# Patient Record
Sex: Male | Born: 2000
Health system: Southern US, Community
[De-identification: ages and names within clinical notes are randomized; demographics above are authoritative.]

## PROBLEM LIST (undated history)

## (undated) HISTORY — PX: OTHER SURGICAL HISTORY: SHX169

---
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
## 2021-08-25 ENCOUNTER — Encounter: Payer: Self-pay | Admitting: Family Medicine

## 2021-08-25 ENCOUNTER — Ambulatory Visit (INDEPENDENT_AMBULATORY_CARE_PROVIDER_SITE_OTHER): Payer: BC Managed Care – PPO | Admitting: Family Medicine

## 2021-08-25 DIAGNOSIS — R0789 Other chest pain: Secondary | ICD-10-CM

## 2021-08-25 MED ORDER — PREDNISONE 10 MG PO TABS: ORAL_TABLET | ORAL | 0 refills | Status: DC

## 2021-08-25 NOTE — Progress Notes (Signed)
Subjective:    Patient ID: Victor [name], male    DOB: Jun 04, 2001, 20 y.o.   MRN: [id number]   HPI: Victor [name] is a 20 y.o. male presenting for chest irritation. Pain between the shoulder blades. 5-7/10 Varies day to day. Not affected by activities. Ibuprofen helps for a couple of hours. Starts to come back after a couple of hours. Not painful to breathe, but feels dyspneic at times. Doesn't slow him down. Denies heartburn. This is different than the GERD he had several months ago. No longer taking the protonix. Denies cough.    Depression screen [facility] 2/9 11/12/2020 11/12/2020 11/15/2018 08/02/2018 01/08/2018  Decreased Interest 0 0 0 0 0  Down, Depressed, Hopeless 0 0 0 0 0  PHQ - 2 Score 0 0 0 0 0  Altered sleeping 0 - - - 0  Tired, decreased energy 1 - - - 0  Change in appetite 1 - - - 0  Feeling bad or failure about yourself  0 - - - 0  Trouble concentrating 0 - - - 0  Moving slowly or fidgety/restless 1 - - - 0  Suicidal thoughts 0 - - - 0  PHQ-9 Score 3 - - - 0     Relevant past medical, surgical, family and social history reviewed and updated as indicated.  Interim medical history since our last visit reviewed. Allergies and medications reviewed and updated.  ROS:  Review of Systems  Constitutional:  Negative for activity change, chills, fatigue and fever.  HENT: Negative.    Respiratory:  Positive for shortness of breath. Negative for cough.   Cardiovascular:  Positive for chest pain.  Gastrointestinal: Negative.  Negative for abdominal pain.  Musculoskeletal:  Positive for back pain (between shoulder blades). Negative for arthralgias.  Skin:  Negative for rash.    Social History   Tobacco Use  Smoking Status Never  Smokeless Tobacco Never       Objective:     Wt Readings from Last 3 Encounters:  11/12/20 154 lb 4 oz (70 kg) (53 %, Z= 0.08)*  04/29/19 149 lb (67.6 kg) (57 %, Z= 0.17)*  02/06/19 151 lb (68.5 kg) (62 %, Z= 0.30)*   * Growth  percentiles are based on CDC (Boys, 2-20 Years) data.     Exam deferred. Pt. Harboring due to COVID 19. Phone visit performed.   Assessment & Plan:   1. Acute chest wall pain      Diagnoses and all orders for this visit:  Acute chest wall pain  Other orders -     predniSONE (DELTASONE) 10 MG tablet; Take 5 daily for 2 days followed by 4,3,2 and 1 for 2 days each.   Virtual Visit via telephone Note  I discussed the limitations, risks, security and privacy concerns of performing an evaluation and management service by telephone and the availability of in person appointments. The patient was identified with two identifiers. Pt.expressed understanding and agreed to proceed. Pt. Is at home. Dr. Darlyn Read is in his office.  Follow Up Instructions:   I discussed the assessment and treatment plan with the patient. The patient was provided an opportunity to ask questions and all were answered. The patient agreed with the plan and demonstrated an understanding of the instructions.   The patient was advised to call back or seek an in-person evaluation if the symptoms worsen or if the condition fails to improve as anticipated.   Total minutes including chart review and phone contact time: 14  Follow up plan: Return if symptoms worsen or fail to improve.  Mechele Claude, MD [place] [facility]

## 2022-03-30 ENCOUNTER — Encounter: Payer: Self-pay | Admitting: Nurse Practitioner

## 2022-03-30 ENCOUNTER — Ambulatory Visit: Payer: BC Managed Care – PPO | Admitting: Nurse Practitioner

## 2022-03-30 ENCOUNTER — Ambulatory Visit (INDEPENDENT_AMBULATORY_CARE_PROVIDER_SITE_OTHER): Payer: BC Managed Care – PPO

## 2022-03-30 VITALS — BP 139/73 | HR 83 | Temp 98.7°F | Resp 18 | Ht 72.0 in | Wt 152.0 lb

## 2022-03-30 DIAGNOSIS — R1013 Epigastric pain: Secondary | ICD-10-CM | POA: Diagnosis not present

## 2022-03-30 DIAGNOSIS — R110 Nausea: Secondary | ICD-10-CM

## 2022-03-30 DIAGNOSIS — R509 Fever, unspecified: Secondary | ICD-10-CM

## 2022-03-30 LAB — URINALYSIS, ROUTINE W REFLEX MICROSCOPIC
Bilirubin, UA: NEGATIVE
Glucose, UA: NEGATIVE
Ketones, UA: NEGATIVE
Leukocytes,UA: NEGATIVE
Nitrite, UA: NEGATIVE
Protein,UA: NEGATIVE
RBC, UA: NEGATIVE
Specific Gravity, UA: 1.015 (ref 1.005–1.030)
Urobilinogen, Ur: 1 mg/dL (ref 0.2–1.0)
pH, UA: 7.5 (ref 5.0–7.5)

## 2022-03-30 MED ORDER — ONDANSETRON HCL 4 MG PO TABS: 4.0000 mg | ORAL_TABLET | Freq: Three times a day (TID) | ORAL | 0 refills | Status: DC | PRN

## 2022-03-30 MED ORDER — ACETAMINOPHEN 500 MG PO TABS: 500.0000 mg | ORAL_TABLET | Freq: Four times a day (QID) | ORAL | 0 refills | Status: DC | PRN

## 2022-03-30 NOTE — Patient Instructions (Signed)
Abdominal Pain, Adult Many things can cause belly (abdominal) pain. Most times, belly pain is not dangerous. Many cases of belly pain can be watched and treated at home. Sometimes, though, belly pain is serious. Your doctor will try to find the cause of your belly pain. Follow these instructions at home:  Medicines Take over-the-counter and prescription medicines only as told by your doctor. Do not take medicines that help you poop (laxatives) unless told by your doctor. General instructions Watch your belly pain for any changes. Drink enough fluid to keep your pee (urine) pale yellow. Keep all follow-up visits as told by your doctor. This is important. Contact a doctor if: Your belly pain changes or gets worse. You are not hungry, or you lose weight without trying. You are having trouble pooping (constipated) or have watery poop (diarrhea) for more than 2-3 days. You have pain when you pee or poop. Your belly pain wakes you up at night. Your pain gets worse with meals, after eating, or with certain foods. You are vomiting and cannot keep anything down. You have a fever. You have blood in your pee. Get help right away if: Your pain does not go away as soon as your doctor says it should. You cannot stop vomiting. Your pain is only in areas of your belly, such as the right side or the left lower part of the belly. You have bloody or black poop, or poop that looks like tar. You have very bad pain, cramping, or bloating in your belly. You have signs of not having enough fluid or water in your body (dehydration), such as: Dark pee, very little pee, or no pee. Cracked lips. Dry mouth. Sunken eyes. Sleepiness. Weakness. You have trouble breathing or chest pain. Summary Many cases of belly pain can be watched and treated at home. Watch your belly pain for any changes. Take over-the-counter and prescription medicines only as told by your doctor. Contact a doctor if your belly pain  changes or gets worse. Get help right away if you have very bad pain, cramping, or bloating in your belly. This information is not intended to replace advice given to you by your health care provider. Make sure you discuss any questions you have with your health care provider. Document Revised: 03/25/2019 Document Reviewed: 03/25/2019 Elsevier Patient Education  2023 Elsevier Inc[employer]  

## 2022-03-30 NOTE — Progress Notes (Signed)
? ?Acute Office Visit ? ?Subjective:  ? ?  ?Patient ID: Victor [name], male    DOB: [birth year], 21 y.o.   MRN: [id number] ? ?Chief Complaint  ?Patient presents with  ? Abdominal Pain  ?  For about a week now , no vomiting , no constipation ? ?Crampy feel , leaning over makes it feel better    ? ? ?Abdominal Pain ?This is a new problem. The current episode started in the past 7 days. The onset quality is gradual. The problem occurs constantly. The problem is unchanged. The pain is located in the generalized abdominal region and epigastric region. The pain is moderate. The pain radiates to the RLQ, RUQ and pelvis. Associated symptoms include nausea. Pertinent negatives include no fever or vomiting. Nothing relieves the symptoms. Past treatments include nothing.  ? ?Review of Systems  ?Constitutional: Negative.  Negative for fever.  ?HENT: Negative.    ?Eyes: Negative.   ?Cardiovascular: Negative.   ?Gastrointestinal:  Positive for abdominal pain and nausea. Negative for vomiting.  ?Skin: Negative.   ?All other systems reviewed and are negative. ? ? ?   ?Objective:  ?  ?BP 139/73 (BP Location: Right Arm, Patient Position: Sitting, Cuff Size: Normal)   Pulse 83   Temp 98.7 ?F (37.1 ?C) (Temporal)   Resp 18   Ht 6' (1.829 m)   Wt 152 lb (68.9 kg)   SpO2 97%   BMI 20.61 kg/m?  ?BP Readings from Last 3 Encounters:  ?03/30/22 139/73  ?11/12/20 137/82  ?04/29/19 (!) 129/76 (84 %, Z = 0.99 /  77 %, Z = 0.74)*  ? ?*BP percentiles are based on the 2017 AAP Clinical Practice Guideline for boys  ? ?Wt Readings from Last 3 Encounters:  ?03/30/22 152 lb (68.9 kg)  ?11/12/20 154 lb 4 oz (70 kg) (53 %, Z= 0.08)*  ?04/29/19 149 lb (67.6 kg) (57 %, Z= 0.17)*  ? ?* Growth percentiles are based on CDC (Boys, 2-20 Years) data.  ? ?  ? ?Physical Exam ?Vitals and nursing note reviewed.  ?Constitutional:   ?   Appearance: Normal appearance. He is well-developed.  ?HENT:  ?   Head: Normocephalic.  ?   Right Ear: External ear normal.   ?   Left Ear: External ear normal.  ?   Nose: Nose normal.  ?   Mouth/Throat:  ?   Mouth: Mucous membranes are moist.  ?Eyes:  ?   Conjunctiva/sclera: Conjunctivae normal.  ?Cardiovascular:  ?   Rate and Rhythm: Normal rate.  ?   Pulses: Normal pulses.  ?   Heart sounds: Normal heart sounds.  ?Pulmonary:  ?   Effort: Pulmonary effort is normal.  ?   Breath sounds: Normal breath sounds.  ?Abdominal:  ?   General: Bowel sounds are normal.  ?   Palpations: Abdomen is soft.  ?Musculoskeletal:     ?   General: Normal range of motion.  ?Skin: ?   General: Skin is warm.  ?   Findings: No rash.  ?Neurological:  ?   General: No focal deficit present.  ?   Mental Status: He is alert. He is disoriented.  ?Psychiatric:     ?   Behavior: Behavior normal.  ? ? ?No results found for any visits on 03/30/22. ? ? ?   ?Assessment & Plan:  ?Nausea, abdominal pain and fever not well controlled in the past few days ?-Completed KUB results pending ?-Urinalysis completed -results pending ?-tylenol or ibuprofen for pain /fever ?-  increase hydration ?-follow up with worsening or uncontrolled symptoms ?Patient may need Ultrasound in the future if symptoms are not resolved, this has been an ongoing issue.  ?Patient and family reports eating [employer] for dinner and everyone in the family has been sick. ? ?Problem List Items Addressed This Visit   ?None ?Visit Diagnoses   ? ? Epigastric pain    -  Primary  ? Relevant Orders  ? Urinalysis, Routine w reflex microscopic  ? DG Abd 1 View  ? Nausea      ? Relevant Medications  ? ondansetron (ZOFRAN) 4 MG tablet  ? Fever, unspecified fever cause      ? Relevant Medications  ? acetaminophen (TYLENOL) 500 MG tablet  ? ?  ? ? ?Meds ordered this encounter  ?Medications  ? ondansetron (ZOFRAN) 4 MG tablet  ?  Sig: Take 1 tablet (4 mg total) by mouth every 8 (eight) hours as needed for nausea or vomiting.  ?  Dispense:  20 tablet  ?  Refill:  0  ?  Order Specific Question:   Supervising Provider  ?  AnswerMechele Claude [[id number]]  ? acetaminophen (TYLENOL) 500 MG tablet  ?  Sig: Take 1 tablet (500 mg total) by mouth every 6 (six) hours as needed.  ?  Dispense:  30 tablet  ?  Refill:  0  ?  Order Specific Question:   Supervising Provider  ?  AnswerMechele Claude [[id number]]  ? ? ?Return if symptoms worsen or fail to improve. ? ?Daryll Drown, NP ? ? ?

## 2022-04-01 NOTE — Progress Notes (Signed)
Patient calling back about results. Please return call.  ?

## 2023-01-05 ENCOUNTER — Ambulatory Visit: Payer: BC Managed Care – PPO | Admitting: Family Medicine

## 2023-01-05 ENCOUNTER — Encounter: Payer: Self-pay | Admitting: Family Medicine

## 2023-01-05 VITALS — BP 139/79 | HR 86 | Ht 72.0 in | Wt 158.0 lb

## 2023-01-05 DIAGNOSIS — H6120 Impacted cerumen, unspecified ear: Secondary | ICD-10-CM

## 2023-01-05 DIAGNOSIS — H6122 Impacted cerumen, left ear: Secondary | ICD-10-CM

## 2023-01-05 NOTE — Progress Notes (Signed)
   BP 139/79   Pulse 86   Ht 6' (1.829 m)   Wt 158 lb (71.7 kg)   SpO2 99%   BMI 21.43 kg/m    Subjective:   Patient ID: Victor [name], male    DOB: May 01, 2001, 22 y.o.   MRN: [id number]  HPI: Victor [name] is a 22 y.o. male presenting on 01/05/2023 for Earwax (Pt has camera at home and he seen earwax in both ears. He denies pain.)   HPI Earwax Patient is coming in today because of problems with earwax.  He says sometimes it will build up to where he gets muffled and he feels like he has in his left ear right now.  He also had a long camera where he checked on the ear and felt like it was more plugged up.  Relevant past medical, surgical, family and social history reviewed and updated as indicated. Interim medical history since our last visit reviewed. Allergies and medications reviewed and updated.  Review of Systems  Constitutional:  Negative for chills and fever.  HENT:  Positive for hearing loss. Negative for ear discharge, ear pain, sinus pressure and sinus pain.     Per HPI unless specifically indicated above   Allergies as of 01/05/2023   No Known Allergies      Medication List        Accurate as of January 05, 2023  3:40 PM. If you have any questions, ask your nurse or doctor.          STOP taking these medications    acetaminophen 500 MG tablet Commonly known as: TYLENOL Stopped by: Fransisca Kaufmann Shadae Reino, MD   ondansetron 4 MG tablet Commonly known as: Zofran Stopped by: Fransisca Kaufmann Idrees Quam, MD         Objective:   BP 139/79   Pulse 86   Ht 6' (1.829 m)   Wt 158 lb (71.7 kg)   SpO2 99%   BMI 21.43 kg/m   Wt Readings from Last 3 Encounters:  01/05/23 158 lb (71.7 kg)  03/30/22 152 lb (68.9 kg)  11/12/20 154 lb 4 oz (70 kg) (53 %, Z= 0.08)*   * Growth percentiles are based on CDC (Boys, 2-20 Years) data.    Physical Exam Vitals and nursing note reviewed.  Constitutional:      Appearance: Normal appearance.  HENT:     Right Ear:  Tympanic membrane and ear canal normal. There is no impacted cerumen.     Left Ear: Tympanic membrane and ear canal normal. There is no impacted cerumen.  Neurological:     Mental Status: He is alert.     Nurse to lavage cerumen, minimal on left side and almost none on right side.  Patient tolerated well  Assessment & Plan:   Problem List Items Addressed This Visit   None Visit Diagnoses     Cerumen in auditory canal on examination    -  Primary        Follow up plan: Return if symptoms worsen or fail to improve.  Counseling provided for all of the vaccine components No orders of the defined types were placed in this encounter.   Caryl Pina, MD [facility] 01/05/2023, 3:40 PM

## 2023-07-26 IMAGING — DX DG ABDOMEN 1V
1 series · 2 of 2 positions shown · non-contrast
Comparison: None Available.

CLINICAL DATA: Lower abdominal pain

EXAM:
ABDOMEN - 1 VIEW

[Series 1: abdomen kub · 0.14mm/px · 2 of 2 slices shown]
[im 1/2]
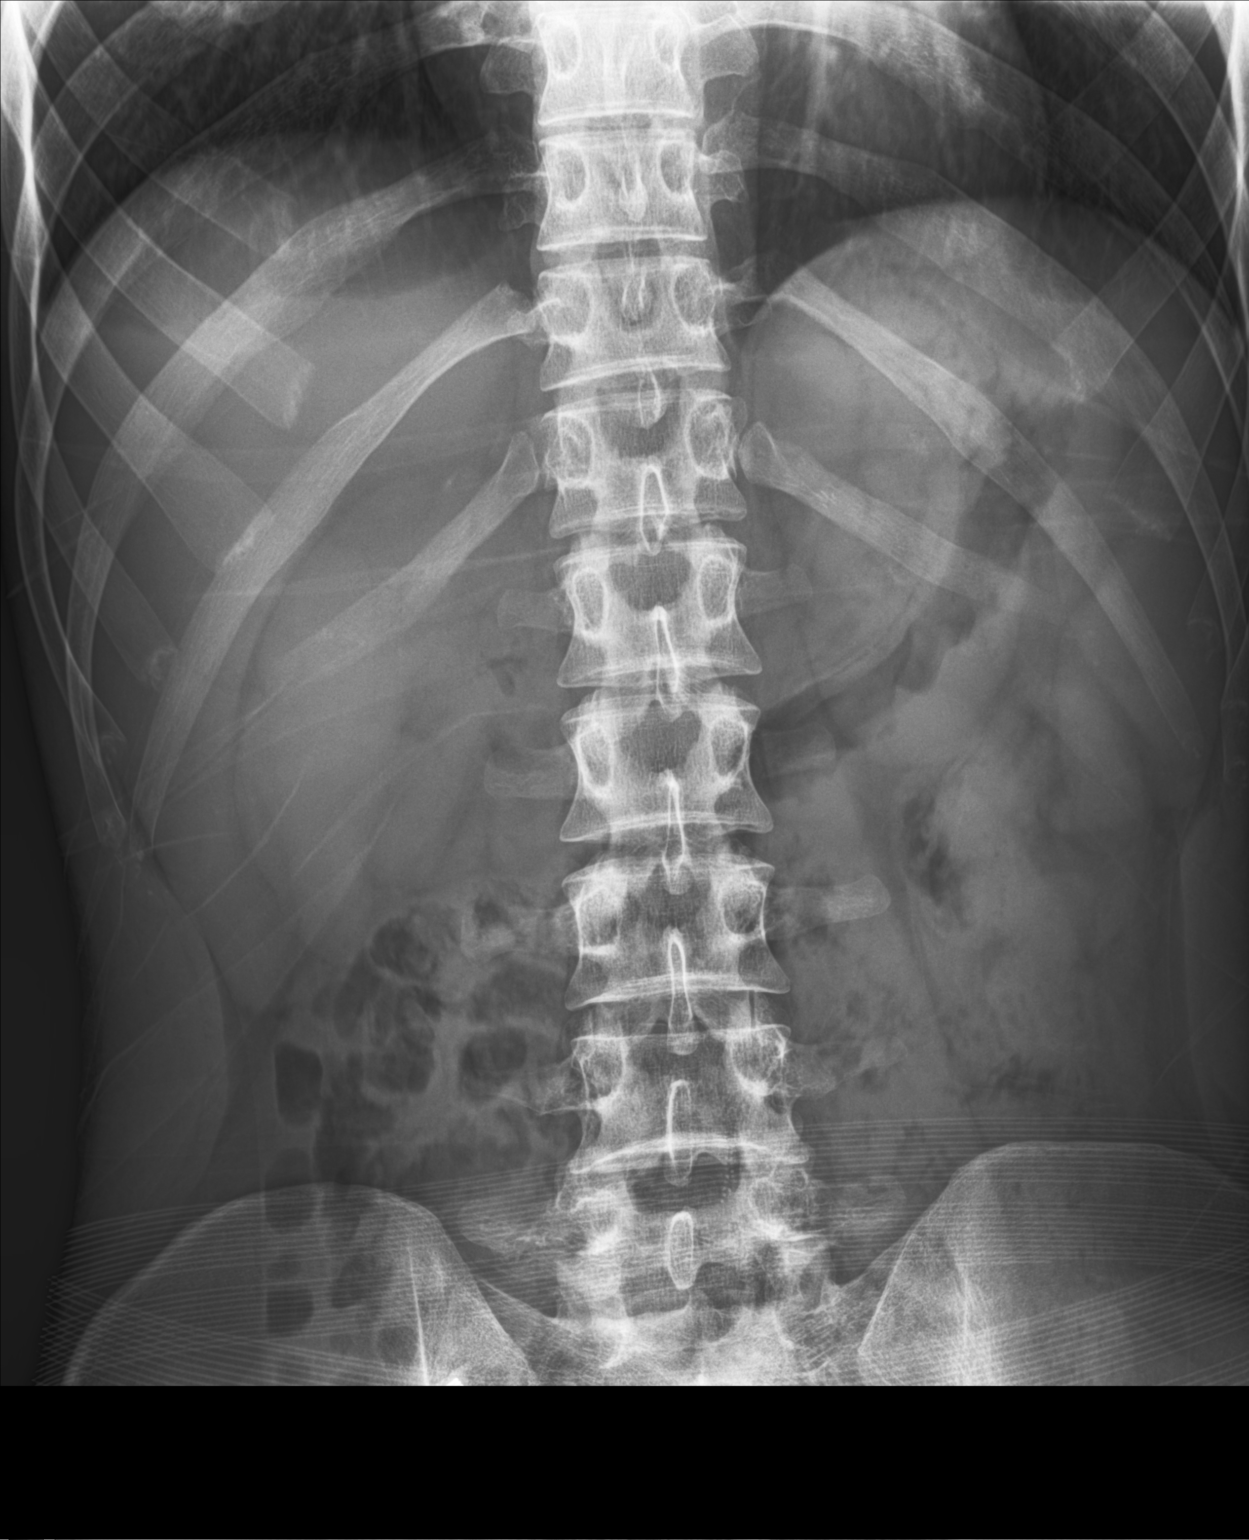
[im 2/2]
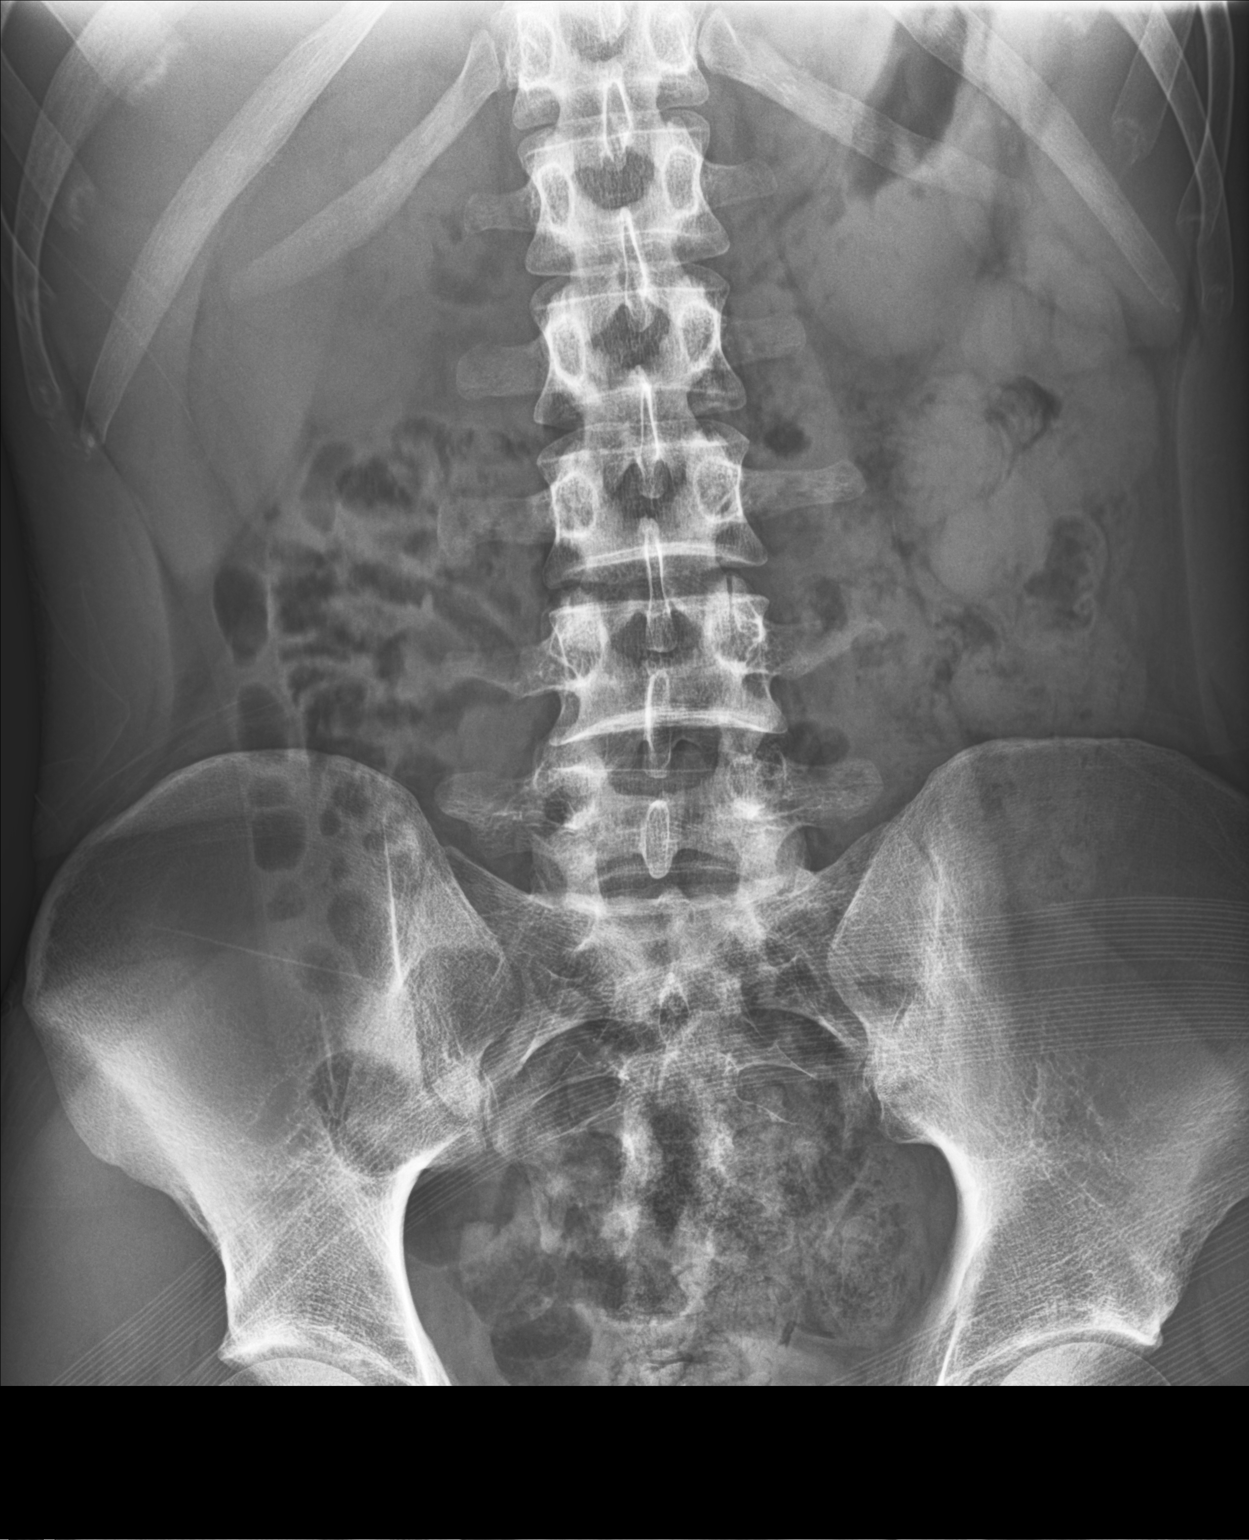

[2 of 2 positions shown; findings below may reference images not displayed]

FINDINGS: Lung bases are clear. Large amount of stool within the distal colon
and rectum. Gas in nondilated loops of large and small bowel.
Osseous structures unremarkable.
IMPRESSION: Large amount of stool in the distal colon compatible with
constipation.

## 2023-10-17 ENCOUNTER — Ambulatory Visit (INDEPENDENT_AMBULATORY_CARE_PROVIDER_SITE_OTHER): Payer: BC Managed Care – PPO | Admitting: Family

## 2023-10-17 VITALS — BP 134/76 | HR 66 | Temp 98.0°F | Ht 72.0 in | Wt 159.0 lb

## 2023-10-17 DIAGNOSIS — Z1159 Encounter for screening for other viral diseases: Secondary | ICD-10-CM | POA: Diagnosis not present

## 2023-10-17 DIAGNOSIS — Z0001 Encounter for general adult medical examination with abnormal findings: Secondary | ICD-10-CM

## 2023-10-17 DIAGNOSIS — Z0000 Encounter for general adult medical examination without abnormal findings: Secondary | ICD-10-CM

## 2023-10-17 DIAGNOSIS — Z114 Encounter for screening for human immunodeficiency virus [HIV]: Secondary | ICD-10-CM | POA: Diagnosis not present

## 2023-10-17 DIAGNOSIS — G44209 Tension-type headache, unspecified, not intractable: Secondary | ICD-10-CM | POA: Diagnosis not present

## 2023-10-17 NOTE — Patient Instructions (Signed)
General Headache Without Cause A headache is pain or discomfort felt around the head or neck area. There are many causes and types of headaches. A few common types include: Tension headaches. Migraine headaches. Cluster headaches. Chronic daily headaches. Sometimes, the specific cause of a headache may not be found. Follow these instructions at home: Watch your condition for any changes. Let your health care provider know about them. Take these steps to help with your condition: Managing pain     Take over-the-counter and prescription medicines only as told by your health care provider. Treatment may include medicines for pain that are taken by mouth or applied to the skin. Lie down in a dark, quiet room when you have a headache. Keep lights dim if bright lights bother you or make your headaches worse. If directed, put ice on your head and neck area: Put ice in a plastic bag. Place a towel between your skin and the bag. Leave the ice on for 20 minutes, 2-3 times per day. Remove the ice if your skin turns bright red. This is very important. If you cannot feel pain, heat, or cold, you have a greater risk of damage to the area. If directed, apply heat to the affected area. Use the heat source that your health care provider recommends, such as a moist heat pack or a heating pad. Place a towel between your skin and the heat source. Leave the heat on for 20-30 minutes. Remove the heat if your skin turns bright red. This is especially important if you are unable to feel pain, heat, or cold. You have a greater risk of getting burned. Eating and drinking Eat meals on a regular schedule. If you drink alcohol: Limit how much you have to: 0-1 drink a day for women who are not pregnant. 0-2 drinks a day for men. Know how much alcohol is in a drink. In the [place] one drink equals one 12 oz bottle of beer (355 mL), one 5 oz glass of wine (148 mL), or one 1 oz glass of hard liquor (44 mL). Stop  drinking caffeine, or decrease the amount of caffeine you drink. Drink enough fluid to keep your urine pale yellow. General instructions  Keep a headache journal to help find out what may trigger your headaches. For example, write down: What you eat and drink. How much sleep you get. Any change to your diet or medicines. Try massage or other relaxation techniques. Limit stress. Sit up straight, and do not tense your muscles. Do not use any products that contain nicotine or tobacco. These products include cigarettes, chewing tobacco, and vaping devices, such as e-cigarettes. If you need help quitting, ask your health care provider. Exercise regularly as told by your health care provider. Sleep on a regular schedule. Get 7-9 hours of sleep each night, or the amount recommended by your health care provider. Keep all follow-up visits. This is important. Contact a health care provider if: Medicine does not help your symptoms. You have a headache that is different from your usual headache. You have nausea or you vomit. You have a fever. Get help right away if: Your headache: Becomes severe quickly. Gets worse after moderate to intense physical activity. You have any of these symptoms: Repeated vomiting. Pain or stiffness in your neck. Changes to your vision. Pain in an eye or ear. Problems with speech. Muscular weakness or loss of muscle control. Loss of balance or coordination. You feel faint or pass out. You have confusion. You have  a seizure. These symptoms may represent a serious problem that is an emergency. Do not wait to see if the symptoms will go away. Get medical help right away. Call your local emergency services (911 in the [place] Do not drive yourself to the hospital. Summary A headache is pain or discomfort felt around the head or neck area. There are many causes and types of headaches. In some cases, the cause may not be found. Keep a headache journal to help find out  what may trigger your headaches. Watch your condition for any changes. Let your health care provider know about them. Contact a health care provider if you have a headache that is different from the usual headache, or if your symptoms are not helped by medicine. Get help right away if your headache becomes severe, you vomit, you have a loss of vision, you lose your balance, or you have a seizure. This information is not intended to replace advice given to you by your health care provider. Make sure you discuss any questions you have with your health care provider. Document Revised: 04/14/2021 Document Reviewed: 04/14/2021 Elsevier Patient Education  2024 [employer].

## 2023-10-17 NOTE — Progress Notes (Signed)
Subjective:    Patient ID: Victor [name], male    DOB: [birth year], 22 y.o.   MRN: [id number]  Chief Complaint  Patient presents with   Annual Exam   Migraine    Been going on for weeks     Pt presents to the office today for CPE. He is currently not taking any medications. Pt denies any palpitations, SOB, or edema at this time.  Headache  This is a new problem. The current episode started 1 to 4 weeks ago. The problem occurs intermittently. The problem has been waxing and waning. The pain is located in the Occipital region. The quality of the pain is described as aching. The pain is at a severity of 6/10. The pain is moderate. Associated symptoms include ear pain, phonophobia and photophobia. Pertinent negatives include no coughing, dizziness, drainage, eye pain, fever, hearing loss, nausea, seizures, sinus pressure, tinnitus, vomiting or weakness. He has tried acetaminophen and NSAIDs for the symptoms. The treatment provided mild relief.      Review of Systems  Constitutional:  Negative for fever.  HENT:  Positive for ear pain. Negative for hearing loss, sinus pressure and tinnitus.   Eyes:  Positive for photophobia. Negative for pain.  Respiratory:  Negative for cough.   Gastrointestinal:  Negative for nausea and vomiting.  Neurological:  Positive for headaches. Negative for dizziness, seizures and weakness.  All other systems reviewed and are negative.  History reviewed. No pertinent family history. Social History   Socioeconomic History   Marital status: Single    Spouse name: Not on file   Number of children: Not on file   Years of education: Not on file   Highest education level: 12th grade  Occupational History   Not on file  Tobacco Use   Smoking status: Never   Smokeless tobacco: Never  Substance and Sexual Activity   Alcohol use: Not on file   Drug use: Not on file   Sexual activity: Not on file  Other Topics Concern   Not on file  Social History Narrative    Not on file   Social Determinants of Health   Financial Resource Strain: Low Risk  (10/17/2023)   Overall Financial Resource Strain (CARDIA)    Difficulty of Paying Living Expenses: Not hard at all  Food Insecurity: No Food Insecurity (10/17/2023)   Hunger Vital Sign    Worried About Running Out of Food in the Last Year: Never true    Ran Out of Food in the Last Year: Never true  Transportation Needs: No Transportation Needs (10/17/2023)   PRAPARE - Administrator, Civil Service (Medical): No    Lack of Transportation (Non-Medical): No  Physical Activity: Insufficiently Active (10/17/2023)   Exercise Vital Sign    Days of Exercise per Week: 3 days    Minutes of Exercise per Session: 30 min  Stress: Stress Concern Present (10/17/2023)   [employer] of Occupational Health - Occupational Stress Questionnaire    Feeling of Stress : To some extent  Social Connections: Moderately Isolated (10/17/2023)   Social Connection and Isolation Panel [NHANES]    Frequency of Communication with Friends and Family: Once a week    Frequency of Social Gatherings with Friends and Family: More than three times a week    Attends Religious Services: 1 to 4 times per year    Active Member of [employer] or Organizations: No    Attends Banker Meetings: Not on file  Marital Status: Never married       Objective:   Physical Exam Vitals reviewed.  Constitutional:      General: He is not in acute distress.    Appearance: He is well-developed.  HENT:     Head: Normocephalic.     Right Ear: Tympanic membrane normal.     Left Ear: Tympanic membrane normal.  Eyes:     General:        Right eye: No discharge.        Left eye: No discharge.     Pupils: Pupils are equal, round, and reactive to light.  Neck:     Thyroid: No thyromegaly.  Cardiovascular:     Rate and Rhythm: Normal rate and regular rhythm.     Heart sounds: Normal heart sounds. No murmur heard. Pulmonary:      Effort: Pulmonary effort is normal. No respiratory distress.     Breath sounds: Normal breath sounds. No wheezing.  Abdominal:     General: Bowel sounds are normal. There is no distension.     Palpations: Abdomen is soft.     Tenderness: There is no abdominal tenderness.  Musculoskeletal:        General: No tenderness. Normal range of motion.     Cervical back: Normal range of motion and neck supple.  Skin:    General: Skin is warm and dry.     Findings: No erythema or rash.  Neurological:     Mental Status: He is alert and oriented to person, place, and time.     Cranial Nerves: No cranial nerve deficit.     Deep Tendon Reflexes: Reflexes are normal and symmetric.  Psychiatric:        Behavior: Behavior normal.        Thought Content: Thought content normal.        Judgment: Judgment normal.          BP 134/76   Pulse 66   Temp 98 F (36.7 C) (Temporal)   Ht 6' (1.829 m)   Wt 159 lb (72.1 kg)   SpO2 99%   BMI 21.56 kg/m   Assessment & Plan:  Eliya Blankman comes in today with chief complaint of Annual Exam and Migraine (Been going on for weeks )   Diagnosis and orders addressed:  1. Annual physical exam - CMP14+EGFR - CBC with Differential/Platelet - Lipid panel - HIV Antibody (routine testing w rflx) - Hepatitis C antibody  2. Need for hepatitis C screening test - CMP14+EGFR - Hepatitis C antibody  3. Encounter for screening for HIV  - CMP14+EGFR - HIV Antibody (routine testing w rflx)  4. Acute non intractable tension-type headache Could be stress related? Take motrin 600 mg TID prn  Zyrtec as needed If pain continues will give Imitrex, if pain continues will need CT   Labs pending Health Maintenance reviewed Diet and exercise encouraged  Follow up plan: 1 year   Jannifer Rodney, FNP

## 2023-10-18 LAB — CMP14+EGFR
ALT: 26 [IU]/L (ref 0–44)
AST: 24 [IU]/L (ref 0–40)
Albumin: 4.8 g/dL (ref 4.3–5.2)
Alkaline Phosphatase: 58 [IU]/L (ref 44–121)
BUN/Creatinine Ratio: 12 (ref 9–20)
BUN: 11 mg/dL (ref 6–20)
Bilirubin Total: 1.7 mg/dL — ABNORMAL HIGH (ref 0.0–1.2)
CO2: 25 mmol/L (ref 20–29)
Calcium: 9.7 mg/dL (ref 8.7–10.2)
Chloride: 101 mmol/L (ref 96–106)
Creatinine, Ser: 0.93 mg/dL (ref 0.76–1.27)
Globulin, Total: 2.2 g/dL (ref 1.5–4.5)
Glucose: 77 mg/dL (ref 70–99)
Potassium: 4.1 mmol/L (ref 3.5–5.2)
Sodium: 141 mmol/L (ref 134–144)
Total Protein: 7 g/dL (ref 6.0–8.5)
eGFR: 120 mL/min/{1.73_m2} (ref >59)

## 2023-10-18 LAB — LIPID PANEL
Chol/HDL Ratio: 4 ratio (ref 0.0–5.0)
Cholesterol, Total: 199 mg/dL (ref 100–199)
HDL: 50 mg/dL (ref >39)
LDL Chol Calc (NIH): 133 mg/dL — ABNORMAL HIGH (ref 0–99)
Triglycerides: 90 mg/dL (ref 0–149)
VLDL Cholesterol Cal: 16 mg/dL (ref 5–40)

## 2023-10-18 LAB — CBC WITH DIFFERENTIAL/PLATELET
Basophils Absolute: 0.1 10*3/uL (ref 0.0–0.2)
Basos: 1 %
EOS (ABSOLUTE): 0.2 10*3/uL (ref 0.0–0.4)
Eos: 4 %
Hematocrit: 48 % (ref 37.5–51.0)
Hemoglobin: 16.3 g/dL (ref 13.0–17.7)
Immature Grans (Abs): 0 10*3/uL (ref 0.0–0.1)
Immature Granulocytes: 0 %
Lymphocytes Absolute: 1.3 10*3/uL (ref 0.7–3.1)
Lymphs: 28 %
MCH: 30.5 pg (ref 26.6–33.0)
MCHC: 34 g/dL (ref 31.5–35.7)
MCV: 90 fL (ref 79–97)
Monocytes Absolute: 0.4 10*3/uL (ref 0.1–0.9)
Monocytes: 9 %
Neutrophils Absolute: 2.6 10*3/uL (ref 1.4–7.0)
Neutrophils: 58 %
Platelets: 183 10*3/uL (ref 150–450)
RBC: 5.34 x10E6/uL (ref 4.14–5.80)
RDW: 12.1 % (ref 11.6–15.4)
WBC: 4.5 10*3/uL (ref 3.4–10.8)

## 2023-10-18 LAB — HEPATITIS C ANTIBODY: Hep C Virus Ab: NONREACTIVE

## 2023-10-18 LAB — HIV ANTIBODY (ROUTINE TESTING W REFLEX): HIV Screen 4th Generation wRfx: NONREACTIVE

## 2024-01-12 ENCOUNTER — Ambulatory Visit: Payer: 59 | Admitting: Family Medicine

## 2024-01-12 ENCOUNTER — Encounter: Payer: Self-pay | Admitting: Family Medicine

## 2024-01-12 VITALS — BP 151/79 | HR 100 | Temp 97.9°F | Wt 156.0 lb

## 2024-01-12 DIAGNOSIS — R52 Pain, unspecified: Secondary | ICD-10-CM

## 2024-01-12 DIAGNOSIS — R051 Acute cough: Secondary | ICD-10-CM

## 2024-01-12 LAB — VERITOR FLU A/B WAIVED
Influenza A: NEGATIVE
Influenza B: NEGATIVE

## 2024-01-12 MED ORDER — FLUTICASONE PROPIONATE 50 MCG/ACT NA SUSP: 1.0000 | Freq: Two times a day (BID) | NASAL | 1 refills | Status: DC | PRN

## 2024-01-12 NOTE — Progress Notes (Signed)
BP (!) 151/79   Pulse 100   Temp 97.9 F (36.6 C)   Wt 156 lb (70.8 kg)   SpO2 100%   BMI 21.16 kg/m    Subjective:   Patient ID: Victor [name], male    DOB: 30-Dec-2000, 23 y.o.   MRN: [id number]  HPI: Victor [name] is a 23 y.o. male presenting on 01/12/2024 for Cough and Generalized Body Aches   HPI Cough and congestion and bodyaches. Patient says it started yesterday with some cough and congestion and bodyaches and then he had some sweats and felt feverish overnight.  Patient says there have been sick contacts at his work with both flu and RSV.  He denies any shortness of breath or wheezing.  His cough so far has been nonproductive.  He has been using some Mucinex nighttime severe cold and he felt like it did help him sleep a little better last night minus the sweats.  Relevant past medical, surgical, family and social history reviewed and updated as indicated. Interim medical history since our last visit reviewed. Allergies and medications reviewed and updated.  Review of Systems  Constitutional:  Positive for chills and fever.  HENT:  Positive for congestion, postnasal drip and rhinorrhea. Negative for ear discharge, ear pain, sinus pressure, sneezing, sore throat and voice change.   Eyes:  Negative for pain, discharge, redness and visual disturbance.  Respiratory:  Negative for shortness of breath and wheezing.   Cardiovascular:  Negative for chest pain and leg swelling.  Musculoskeletal:  Positive for myalgias. Negative for gait problem.  Skin:  Negative for rash.  All other systems reviewed and are negative.   Per HPI unless specifically indicated above   Allergies as of 01/12/2024   No Known Allergies      Medication List    as of January 12, 2024  8:59 AM   You have not been prescribed any medications.      Objective:   BP (!) 151/79   Pulse 100   Temp 97.9 F (36.6 C)   Wt 156 lb (70.8 kg)   SpO2 100%   BMI 21.16 kg/m   Wt Readings from  Last 3 Encounters:  01/12/24 156 lb (70.8 kg)  10/17/23 159 lb (72.1 kg)  01/05/23 158 lb (71.7 kg)    Physical Exam Vitals and nursing note reviewed.  Constitutional:      General: He is not in acute distress.    Appearance: He is well-developed. He is not diaphoretic.  HENT:     Right Ear: Tympanic membrane, ear canal and external ear normal.     Left Ear: Tympanic membrane, ear canal and external ear normal.     Nose: Mucosal edema and rhinorrhea present.     Right Sinus: Maxillary sinus tenderness present. No frontal sinus tenderness.     Left Sinus: Maxillary sinus tenderness present. No frontal sinus tenderness.     Mouth/Throat:     Pharynx: Uvula midline. Posterior oropharyngeal erythema present. No oropharyngeal exudate.     Tonsils: No tonsillar abscesses.  Eyes:     General: No scleral icterus.    Conjunctiva/sclera: Conjunctivae normal.  Neck:     Thyroid: No thyromegaly.  Cardiovascular:     Rate and Rhythm: Normal rate and regular rhythm.     Heart sounds: Normal heart sounds. No murmur heard. Pulmonary:     Effort: Pulmonary effort is normal. No respiratory distress.     Breath sounds: Normal breath sounds. No wheezing, rhonchi or  rales.  Musculoskeletal:        General: Normal range of motion.     Cervical back: Neck supple.  Lymphadenopathy:     Cervical: No cervical adenopathy.  Skin:    General: Skin is warm and dry.     Findings: No rash.  Neurological:     Mental Status: He is alert and oriented to person, place, and time.     Coordination: Coordination normal.  Psychiatric:        Behavior: Behavior normal.     Flu: Negative  Assessment & Plan:   Problem List Items Addressed This Visit   None Visit Diagnoses       Acute cough    -  Primary   Relevant Orders   Veritor Flu A/B Waived     Body aches       Relevant Orders   Veritor Flu A/B Waived       Flu negative, recommended Flonase and Mucinex and Tylenol and ibuprofen and lots of  fluids.  Call us if worsens  May return to work when 24 hours without fever. Follow up plan: Return if symptoms worsen or fail to improve.  Counseling provided for all of the vaccine components Orders Placed This Encounter  Procedures   Veritor Flu A/B Waived    Arville Care, MD [facility] 01/12/2024, 8:59 AM

## 2024-04-15 ENCOUNTER — Ambulatory Visit: Admitting: Family

## 2024-05-28 ENCOUNTER — Ambulatory Visit: Payer: Self-pay

## 2024-05-28 ENCOUNTER — Ambulatory Visit: Admitting: Family Medicine

## 2024-05-28 VITALS — BP 135/79 | HR 73 | Temp 98.0°F | Wt 158.0 lb

## 2024-05-28 DIAGNOSIS — W57XXXA Bitten or stung by nonvenomous insect and other nonvenomous arthropods, initial encounter: Secondary | ICD-10-CM

## 2024-05-28 DIAGNOSIS — S90869A Insect bite (nonvenomous), unspecified foot, initial encounter: Secondary | ICD-10-CM | POA: Diagnosis not present

## 2024-05-28 MED ORDER — FLUOCINONIDE 0.05 % EX CREA: 1.0000 | TOPICAL_CREAM | Freq: Two times a day (BID) | CUTANEOUS | 5 refills | Status: DC

## 2024-05-28 MED ORDER — BETAMETHASONE SOD PHOS & ACET 6 (3-3) MG/ML IJ SUSP: 6.0000 mg | Freq: Once | INTRAMUSCULAR | Status: AC

## 2024-05-28 NOTE — Patient Instructions (Addendum)
 Aveeno colloidal oatmeal soak will help relieve itching and discomfort of the

## 2024-05-28 NOTE — Telephone Encounter (Signed)
  FYI Only or Action Required?: FYI only for provider.  Patient was last seen in primary care on 01/12/2024 by Dettinger, Fonda LABOR, MD. Called Nurse Triage reporting Insect Bite. Symptoms began several days ago. Interventions attempted: Nothing. Symptoms are: gradually worsening.  Triage Disposition: See HCP Within 4 Hours (Or PCP Triage)  Patient/caregiver understands and will follow disposition?: Yes    Copied from CRM [id number]. Topic: Clinical - Red Word Triage >> May 28, 2024  7:32 AM Larissa RAMAN wrote: [facility] that prompted transfer to Nurse Triage: insect bites- waist down with pain, redness, swelling Reason for Disposition  [1] SEVERE bite pain AND [2] not improved after 2 hours of pain medicine  Answer Assessment - Initial Assessment Questions 1. TYPE of INSECT: What type of insect was it?      unknown 2. ONSET: When did you get bitten?      Saturday 3. LOCATION: Where is the insect bite located?      From the waste down 4. REDNESS: Is the area red or pink? If Yes, ask: What size is area of redness? (inches or cm). When did the redness start?     Huge red and itchy and blisters 5. PAIN: Is there any pain? If Yes, ask: How bad is it?  (Scale 1-10; or mild, moderate, severe)     moderate 6. ITCHING: Does it itch? If Yes, ask: How bad is the itch?    - MILD: doesn't interfere with normal activities   - MODERATE-SEVERE: interferes with work, school, sleep, or other activities      severe 7. SWELLING: How big is the swelling? (inches, cm, or compare to coins)     Yes, some are blisters and popping 8. OTHER SYMPTOMS: Do you have any other symptoms?  (e.g., difficulty breathing, hives)     denies 9. PREGNANCY: Is there any chance you are pregnant? When was your last menstrual period?     na  Protocols used: Insect Bite-A-AH

## 2024-05-28 NOTE — Telephone Encounter (Signed)
 E2C2 scheduled appointment.

## 2024-05-28 NOTE — Progress Notes (Signed)
 Chief Complaint  Patient presents with   Bug bite    Several bug bites appeared on Saturday. All over ankles and few on legs and hips. Itchy and painful. Starting to swell and drain.     HPI  Patient presents today for patient tells me that he was out helping his  brother-in-law working in the garden near the woods and this was 3 days ago.  He did not notice anything till the next day but then he started getting red bumps that have progressed up to all about the lower one third of the lower leg and have become more deep red and developed blisterlike bulla.  There is no known exposure.  However the possibility is for poison ivy or chiggers or fire ants or similar bugs.  No systemic symptoms such as jitteriness palpitations shortness of breath lips gums mouth swelling have been noted. PMH: Smoking status noted Review of Systems  Objective: BP 135/79   Pulse 73   Temp 98 F (36.7 C)   Wt 158 lb (71.7 kg)   SpO2 98%   BMI 21.43 kg/m  Gen: NAD, alert, cooperative with exam HEENT: NCAT, lips are nonswollen patient is articulate with no evidence of swelling Ext: No edema, warm.  There are discrete areas of erythema measuring anywhere from 1 to 3 cm each some with central bulla.  The is cover much of the dorsal feet to the ankles and slightly above the ankles there is more involvement over the right foot and lower leg than there is on the left although both are very significant. Neuro: Alert and oriented, No gross deficits  Insect bite of foot, unspecified laterality, initial encounter -     Betamethasone Sod Phos & Acet  Other orders -     Fluocinonide; Apply 1 Application topically 2 (two) times daily.  Dispense: 30 g; Refill: 5   This most resembles a reaction to fire ant bites that I have seen in the past.  He did not noticed the bites the day before.  Although he was quite busy helping his brother move and out building.  In addition to the treatments above I recommended Aveeno bath for  him.

## 2024-09-17 ENCOUNTER — Encounter: Payer: Self-pay | Admitting: *Deleted

## 2024-10-17 ENCOUNTER — Encounter: Payer: BC Managed Care – PPO | Admitting: Family

## 2024-10-22 ENCOUNTER — Encounter: Payer: Self-pay | Admitting: Family

## 2024-11-08 ENCOUNTER — Ambulatory Visit

## 2024-11-08 ENCOUNTER — Ambulatory Visit: Payer: Self-pay | Admitting: Nurse Practitioner

## 2024-11-08 ENCOUNTER — Ambulatory Visit: Payer: Self-pay | Admitting: *Deleted

## 2024-11-08 ENCOUNTER — Encounter (HOSPITAL_COMMUNITY): Admission: EM | Disposition: A | Payer: Self-pay | Source: Ambulatory Visit | Attending: Emergency Medicine

## 2024-11-08 ENCOUNTER — Observation Stay (HOSPITAL_COMMUNITY): Admitting: Anesthesiology

## 2024-11-08 ENCOUNTER — Ambulatory Visit: Admitting: Nurse Practitioner

## 2024-11-08 ENCOUNTER — Observation Stay (HOSPITAL_COMMUNITY)
Admission: EM | Admit: 2024-11-08 | Discharge: 2024-11-09 | Disposition: A | Source: Ambulatory Visit | Attending: Emergency Medicine | Admitting: Emergency Medicine

## 2024-11-08 ENCOUNTER — Ambulatory Visit (HOSPITAL_COMMUNITY)
Admission: RE | Admit: 2024-11-08 | Discharge: 2024-11-08 | Disposition: A | Source: Ambulatory Visit | Attending: Nurse Practitioner | Admitting: Nurse Practitioner

## 2024-11-08 ENCOUNTER — Other Ambulatory Visit: Payer: Self-pay

## 2024-11-08 ENCOUNTER — Encounter (HOSPITAL_COMMUNITY): Payer: Self-pay | Admitting: Anesthesiology

## 2024-11-08 VITALS — BP 137/82 | HR 89 | Temp 98.0°F | Ht 72.0 in | Wt 161.0 lb

## 2024-11-08 DIAGNOSIS — R109 Unspecified abdominal pain: Secondary | ICD-10-CM

## 2024-11-08 DIAGNOSIS — K353 Acute appendicitis with localized peritonitis, without perforation or gangrene: Secondary | ICD-10-CM | POA: Diagnosis not present

## 2024-11-08 DIAGNOSIS — R1031 Right lower quadrant pain: Secondary | ICD-10-CM

## 2024-11-08 DIAGNOSIS — K3580 Unspecified acute appendicitis: Principal | ICD-10-CM | POA: Diagnosis present

## 2024-11-08 LAB — COMPREHENSIVE METABOLIC PANEL WITH GFR
ALT: 21 U/L (ref 0–44)
AST: 21 U/L (ref 15–41)
Albumin: 5 g/dL (ref 3.5–5.0)
Alkaline Phosphatase: 61 U/L (ref 38–126)
Anion gap: 15 (ref 5–15)
BUN: 12 mg/dL (ref 6–20)
CO2: 24 mmol/L (ref 22–32)
Calcium: 9.5 mg/dL (ref 8.9–10.3)
Chloride: 101 mmol/L (ref 98–111)
Creatinine, Ser: 0.83 mg/dL (ref 0.61–1.24)
GFR, Estimated: >60 mL/min (ref >60)
Glucose, Bld: 87 mg/dL (ref 70–99)
Potassium: 4 mmol/L (ref 3.5–5.1)
Sodium: 139 mmol/L (ref 135–145)
Total Bilirubin: 2.4 mg/dL — ABNORMAL HIGH (ref 0.0–1.2)
Total Protein: 7.4 g/dL (ref 6.5–8.1)

## 2024-11-08 LAB — URINALYSIS, ROUTINE W REFLEX MICROSCOPIC
Bilirubin, UA: NEGATIVE
Glucose, UA: NEGATIVE
Ketones, UA: NEGATIVE
Leukocytes,UA: NEGATIVE
Nitrite, UA: NEGATIVE
Protein,UA: NEGATIVE
RBC, UA: NEGATIVE
Specific Gravity, UA: 1.015 (ref 1.005–1.030)
Urobilinogen, Ur: 0.2 mg/dL (ref 0.2–1.0)
pH, UA: 7 (ref 5.0–7.5)

## 2024-11-08 LAB — CBC WITH DIFFERENTIAL/PLATELET
Abs Immature Granulocytes: 0.03 K/uL (ref 0.00–0.07)
Basophils Absolute: 0.1 K/uL (ref 0.0–0.1)
Basophils Relative: 0 %
Eosinophils Absolute: 0.1 K/uL (ref 0.0–0.5)
Eosinophils Relative: 0 %
HCT: 46.7 % (ref 39.0–52.0)
Hemoglobin: 16.2 g/dL (ref 13.0–17.0)
Immature Granulocytes: 0 %
Lymphocytes Relative: 10 %
Lymphs Abs: 1.1 K/uL (ref 0.7–4.0)
MCH: 29.9 pg (ref 26.0–34.0)
MCHC: 34.7 g/dL (ref 30.0–36.0)
MCV: 86.3 fL (ref 80.0–100.0)
Monocytes Absolute: 0.8 K/uL (ref 0.1–1.0)
Monocytes Relative: 7 %
Neutro Abs: 9.3 K/uL — ABNORMAL HIGH (ref 1.7–7.7)
Neutrophils Relative %: 83 %
Platelets: 237 K/uL (ref 150–400)
RBC: 5.41 MIL/uL (ref 4.22–5.81)
RDW: 11.9 % (ref 11.5–15.5)
WBC: 11.3 K/uL — ABNORMAL HIGH (ref 4.0–10.5)
nRBC: 0 % (ref 0.0–0.2)

## 2024-11-08 LAB — LIPASE, BLOOD: Lipase: 26 U/L (ref 11–51)

## 2024-11-08 SURGERY — APPENDECTOMY, ROBOT-ASSISTED, LAPAROSCOPIC
Anesthesia: General | Site: Abdomen

## 2024-11-08 MED ORDER — ONDANSETRON HCL 4 MG/2ML IJ SOLN: 4.0000 mg | Freq: Four times a day (QID) | INTRAMUSCULAR | Status: DC | PRN

## 2024-11-08 MED ORDER — ENOXAPARIN SODIUM 40 MG/0.4ML IJ SOSY
PREFILLED_SYRINGE | INTRAMUSCULAR | Status: AC
  Administered 2024-11-08: 40 mg via SUBCUTANEOUS
  Filled 2024-11-08: qty 0.4

## 2024-11-08 MED ORDER — SENNOSIDES-DOCUSATE SODIUM 8.6-50 MG PO TABS
1.0000 | ORAL_TABLET | Freq: Two times a day (BID) | ORAL | Status: DC
  Administered 2024-11-08 – 2024-11-09 (×2): 1 via ORAL
  Filled 2024-11-08 (×2): qty 1

## 2024-11-08 MED ORDER — PROPOFOL 10 MG/ML IV BOLUS
INTRAVENOUS | Status: DC | PRN
  Administered 2024-11-08: 180 mg via INTRAVENOUS

## 2024-11-08 MED ORDER — FENTANYL CITRATE (PF) 250 MCG/5ML IJ SOLN
INTRAMUSCULAR | Status: AC
  Filled 2024-11-08: qty 5

## 2024-11-08 MED ORDER — ONDANSETRON HCL 4 MG/2ML IJ SOLN: 4.0000 mg | Freq: Once | INTRAMUSCULAR | Status: DC | PRN

## 2024-11-08 MED ORDER — ONDANSETRON 4 MG PO TBDP: 4.0000 mg | ORAL_TABLET | Freq: Four times a day (QID) | ORAL | Status: DC | PRN

## 2024-11-08 MED ORDER — DOCUSATE SODIUM 100 MG PO CAPS: 100.0000 mg | ORAL_CAPSULE | Freq: Two times a day (BID) | ORAL | 2 refills | Status: DC

## 2024-11-08 MED ORDER — SODIUM CHLORIDE 0.9 % IV SOLN
INTRAVENOUS | Status: AC
  Filled 2024-11-08: qty 20

## 2024-11-08 MED ORDER — SUGAMMADEX SODIUM 200 MG/2ML IV SOLN
INTRAVENOUS | Status: DC | PRN
  Administered 2024-11-08: 150 mg via INTRAVENOUS

## 2024-11-08 MED ORDER — LACTATED RINGERS IV SOLN: INTRAVENOUS | Status: DC

## 2024-11-08 MED ORDER — ROCURONIUM 10MG/ML (10ML) SYRINGE FOR MEDFUSION PUMP - OPTIME
INTRAVENOUS | Status: DC | PRN
  Administered 2024-11-08: 10 mg via INTRAVENOUS
  Administered 2024-11-08: 50 mg via INTRAVENOUS
  Administered 2024-11-08: 10 mg via INTRAVENOUS

## 2024-11-08 MED ORDER — OXYCODONE HCL 5 MG/5ML PO SOLN: 5.0000 mg | Freq: Once | ORAL | Status: DC | PRN

## 2024-11-08 MED ORDER — CHLORHEXIDINE GLUCONATE 0.12 % MT SOLN
OROMUCOSAL | Status: AC
  Administered 2024-11-08: 15 mL
  Filled 2024-11-08: qty 15

## 2024-11-08 MED ORDER — ACETAMINOPHEN 500 MG PO TABS
1000.0000 mg | ORAL_TABLET | Freq: Four times a day (QID) | ORAL | Status: DC
  Administered 2024-11-08 – 2024-11-09 (×5): 1000 mg via ORAL
  Filled 2024-11-08 (×5): qty 2

## 2024-11-08 MED ORDER — LACTATED RINGERS IV BOLUS
1000.0000 mL | Freq: Once | INTRAVENOUS | Status: AC
  Administered 2024-11-08: 1000 mL via INTRAVENOUS

## 2024-11-08 MED ORDER — BUPIVACAINE HCL (PF) 0.5 % IJ SOLN
INTRAMUSCULAR | Status: AC
  Filled 2024-11-08: qty 30

## 2024-11-08 MED ORDER — SODIUM CHLORIDE 0.9 % IV SOLN
2.0000 g | Freq: Once | INTRAVENOUS | Status: AC
  Administered 2024-11-08: 2 g via INTRAVENOUS

## 2024-11-08 MED ORDER — STERILE WATER FOR IRRIGATION IR SOLN
Status: DC | PRN
  Administered 2024-11-08: 1

## 2024-11-08 MED ORDER — BUPIVACAINE HCL (PF) 0.5 % IJ SOLN
INTRAMUSCULAR | Status: DC | PRN
  Administered 2024-11-08: 30 mL

## 2024-11-08 MED ORDER — MIDAZOLAM HCL 5 MG/5ML IJ SOLN
INTRAMUSCULAR | Status: DC | PRN
  Administered 2024-11-08: 2 mg via INTRAVENOUS

## 2024-11-08 MED ORDER — LIDOCAINE HCL (CARDIAC) PF 50 MG/5ML IV SOSY
PREFILLED_SYRINGE | INTRAVENOUS | Status: DC | PRN
  Administered 2024-11-08: 80 mg via INTRAVENOUS

## 2024-11-08 MED ORDER — ONDANSETRON HCL 4 MG PO TABS: 4.0000 mg | ORAL_TABLET | Freq: Every day | ORAL | 1 refills | Status: DC | PRN

## 2024-11-08 MED ORDER — FENTANYL CITRATE (PF) 50 MCG/ML IJ SOSY
PREFILLED_SYRINGE | INTRAMUSCULAR | Status: AC
  Filled 2024-11-08: qty 1

## 2024-11-08 MED ORDER — ENOXAPARIN SODIUM 40 MG/0.4ML IJ SOSY: 40.0000 mg | PREFILLED_SYRINGE | INTRAMUSCULAR | Status: DC

## 2024-11-08 MED ORDER — MORPHINE SULFATE (PF) 2 MG/ML IV SOLN: 2.0000 mg | INTRAVENOUS | Status: DC | PRN

## 2024-11-08 MED ORDER — OXYCODONE HCL 5 MG PO TABS
5.0000 mg | ORAL_TABLET | ORAL | Status: DC | PRN
  Administered 2024-11-08: 10 mg via ORAL
  Filled 2024-11-08: qty 2

## 2024-11-08 MED ORDER — METRONIDAZOLE 500 MG/100ML IV SOLN
INTRAVENOUS | Status: AC
  Filled 2024-11-08: qty 100

## 2024-11-08 MED ORDER — DEXAMETHASONE SOD PHOSPHATE PF 10 MG/ML IJ SOLN
INTRAMUSCULAR | Status: DC | PRN
  Administered 2024-11-08: 8 mg via INTRAVENOUS

## 2024-11-08 MED ORDER — OXYCODONE HCL 5 MG PO TABS: 5.0000 mg | ORAL_TABLET | Freq: Once | ORAL | Status: DC | PRN

## 2024-11-08 MED ORDER — FENTANYL CITRATE (PF) 50 MCG/ML IJ SOSY
25.0000 ug | PREFILLED_SYRINGE | INTRAMUSCULAR | Status: DC | PRN
  Administered 2024-11-08 (×2): 50 ug via INTRAVENOUS
  Filled 2024-11-08: qty 1

## 2024-11-08 MED ORDER — METRONIDAZOLE 500 MG/100ML IV SOLN
500.0000 mg | Freq: Once | INTRAVENOUS | Status: AC
  Administered 2024-11-08: 500 mg via INTRAVENOUS

## 2024-11-08 MED ORDER — FENTANYL CITRATE (PF) 100 MCG/2ML IJ SOLN
INTRAMUSCULAR | Status: DC | PRN
  Administered 2024-11-08 (×5): 50 ug via INTRAVENOUS

## 2024-11-08 MED ORDER — MIDAZOLAM HCL 2 MG/2ML IJ SOLN
INTRAMUSCULAR | Status: AC
  Filled 2024-11-08: qty 2

## 2024-11-08 MED ORDER — ACETAMINOPHEN 500 MG PO TABS: 1000.0000 mg | ORAL_TABLET | Freq: Four times a day (QID) | ORAL | 0 refills | Status: AC

## 2024-11-08 MED ORDER — OXYCODONE HCL 5 MG PO TABS: 5.0000 mg | ORAL_TABLET | Freq: Four times a day (QID) | ORAL | 0 refills | Status: DC | PRN

## 2024-11-08 MED ORDER — ONDANSETRON HCL 4 MG/2ML IJ SOLN
INTRAMUSCULAR | Status: DC | PRN
  Administered 2024-11-08: 4 mg via INTRAVENOUS

## 2024-11-08 MED ORDER — IOHEXOL 300 MG/ML  SOLN
100.0000 mL | Freq: Once | INTRAMUSCULAR | Status: AC | PRN
  Administered 2024-11-08: 100 mL via INTRAVENOUS

## 2024-11-08 SURGICAL SUPPLY — 37 items
CHLORAPREP W/TINT 26 (MISCELLANEOUS) ×1 IMPLANT
COVER LIGHT HANDLE STERIS (MISCELLANEOUS) ×2 IMPLANT
COVER MAYO STAND XLG (MISCELLANEOUS) ×1 IMPLANT
COVER TIP SHEARS 8 DVNC (MISCELLANEOUS) IMPLANT
DEFOGGER SCOPE WARM SEASHARP (MISCELLANEOUS) ×1 IMPLANT
DERMABOND ADVANCED .7 DNX12 (GAUZE/BANDAGES/DRESSINGS) ×1 IMPLANT
DRAPE ARM DVNC X/XI (DISPOSABLE) ×3 IMPLANT
DRAPE COLUMN DVNC XI (DISPOSABLE) ×1 IMPLANT
FORCEPS BPLR R/ABLATION 8 DVNC (INSTRUMENTS) ×1 IMPLANT
GLOVE BIOGEL PI IND STRL 6.5 (GLOVE) ×2 IMPLANT
GLOVE BIOGEL PI IND STRL 7.0 (GLOVE) ×3 IMPLANT
GLOVE SURG SS PI 6.5 STRL IVOR (GLOVE) ×2 IMPLANT
GOWN STRL REUS W/TWL LRG LVL3 (GOWN DISPOSABLE) ×3 IMPLANT
GRASPER SUT TROCAR 14GX15 (MISCELLANEOUS) ×1 IMPLANT
KIT PINK PAD W/HEAD ARM REST (MISCELLANEOUS) ×1 IMPLANT
KIT TURNOVER KIT A (KITS) ×1 IMPLANT
MANIFOLD NEPTUNE II (INSTRUMENTS) ×1 IMPLANT
NDL HYPO 21X1.5 SAFETY (NEEDLE) ×1 IMPLANT
NDL INSUFFLATION 14GA 120MM (NEEDLE) ×1 IMPLANT
OBTURATOR OPTICALSTD 8 DVNC (TROCAR) ×1 IMPLANT
PACK LAP CHOLE LZT030E (CUSTOM PROCEDURE TRAY) ×1 IMPLANT
PENCIL HANDSWITCHING (ELECTRODE) ×1 IMPLANT
RELOAD STAPLE 30 2.5 WHT DVNC (STAPLE) ×1 IMPLANT
SCISSORS MNPLR CVD DVNC XI (INSTRUMENTS) IMPLANT
SEAL UNIV 5-12 XI (MISCELLANEOUS) ×4 IMPLANT
SEALER VESSEL EXT DVNC XI (MISCELLANEOUS) ×1 IMPLANT
SET BASIN LINEN APH (SET/KITS/TRAYS/PACK) ×1 IMPLANT
SET TUBE SMOKE EVAC HIGH FLOW (TUBING) ×1 IMPLANT
STAPLER 30 CRVD 8 SUREFORM (STAPLE) ×1 IMPLANT
SUT MNCRL AB 4-0 PS2 18 (SUTURE) ×1 IMPLANT
SUT VICRYL 0 UR6 27IN ABS (SUTURE) ×1 IMPLANT
SYR 30ML LL (SYRINGE) ×1 IMPLANT
SYSTEM RETRIEVL 5MM INZII UNIV (BASKET) ×1 IMPLANT
TAPE TRANSPORE STRL 2 31045 (GAUZE/BANDAGES/DRESSINGS) ×1 IMPLANT
TRAY FOLEY W/BAG SLVR 16FR ST (SET/KITS/TRAYS/PACK) ×1 IMPLANT
TUBE CONNECTING 12X1/4 (SUCTIONS) IMPLANT
WATER STERILE IRR 500ML POUR (IV SOLUTION) ×1 IMPLANT

## 2024-11-08 NOTE — Transfer of Care (Signed)
 Immediate Anesthesia Transfer of Care Note  Patient: Victor [name]  Procedure(s) Performed: APPENDECTOMY, ROBOT-ASSISTED, LAPAROSCOPIC (Abdomen)  Patient Location: PACU  Anesthesia Type:General  Level of Consciousness: awake  Airway & Oxygen Therapy: Patient Spontanous Breathing  Post-op Assessment: Report given to RN  Post vital signs: Reviewed and stable  Last Vitals:  Vitals Value Taken Time  BP 128/72 11/08/24 16:07  Temp    Pulse 103 11/08/24 16:09  Resp 18 11/08/24 16:09  SpO2 100 % 11/08/24 16:09  Vitals shown include unfiled device data.  Last Pain:  Vitals:   11/08/24 1418  TempSrc:   PainSc: 2       Patients Stated Pain Goal: 7 (11/08/24 1401)  Complications: No notable events documented.

## 2024-11-08 NOTE — ED Triage Notes (Signed)
 Pt was sent by PCP for surgical consult. Pt had CT this morning and was called when he got home to come to ER for appendicitis. Pt complains of abd pain no nausea or vomiting. Pain started suddenly at 12am today

## 2024-11-08 NOTE — Progress Notes (Addendum)
° °  Subjective:    Patient ID: Victor [name], male    DOB: [birth year], 23 y.o.   MRN: [id number]    Chief Complaint: Abdominal Pain (Lower abdomen. Started yesterday and woke him up last night)   Abdominal Pain This is a new problem. The current episode started yesterday. The onset quality is sudden. The problem occurs constantly. The problem has been gradually worsening since onset. The pain is located in the RLQ and LLQ. The pain is at a severity of 10/10. The patient is experiencing no pain. The quality of the pain is described as sharp. The pain radiates to the LLQ and RLQ. Pertinent negatives include no constipation, diarrhea, nausea or vomiting. The symptoms are relieved by recumbency. Past treatments include nothing. The treatment provided no improvement relief.     Patient Active Problem List   Diagnosis Date Noted   Gastroesophageal reflux disease without esophagitis 04/29/2019       Review of Systems  Gastrointestinal:  Positive for abdominal pain. Negative for constipation, diarrhea, nausea and vomiting.       Objective:   Physical Exam Constitutional:      Appearance: He is well-developed.  Cardiovascular:     Rate and Rhythm: Normal rate and regular rhythm.     Heart sounds: Normal heart sounds.  Pulmonary:     Breath sounds: Normal breath sounds.  Abdominal:     General: Abdomen is flat. Bowel sounds are normal.     Palpations: There is no mass.     Tenderness: There is abdominal tenderness (RLQ pain on palpation). There is guarding (slightly). There is no rebound.     Hernia: No hernia is present.  Neurological:     General: No focal deficit present.     Mental Status: He is alert and oriented to person, place, and time.  Psychiatric:        Mood and Affect: Mood normal.        Behavior: Behavior normal.    BP 137/82   Pulse 89   Temp 98 F (36.7 C) (Temporal)   Ht 6' (1.829 m)   Wt 161 lb (73 kg)   SpO2 97%   BMI 21.84 kg/m    KUB- moderate  stool and gas noted throughout colon-Preliminary reading by Ronal Lunger, FNP  [facility]  Urine clear    Assessment & Plan:   Victor [name] in today with chief complaint of Abdominal Pain (Lower abdomen. Started yesterday and woke him up last night)   1. Abdominal pain, unspecified abdominal location (Primary) - Urinalysis, Routine w reflex microscopic - Urine Culture - DG Abd 1 View  2. Right lower quadrant abdominal pain Will talk once results are back - CT ABDOMEN PELVIS W CONTRAST; Future  Addendum- ct scan acute appendicitis- sent to ED at [facility]. Do not know why they did not hold patient in xray until we told them what to do. Patient was on way home when contacted  to go to the ED.  The above assessment and management plan was discussed with the patient. The patient verbalized understanding of and has agreed to the management plan. Patient is aware to call the clinic if symptoms persist or worsen. Patient is aware when to return to the clinic for a follow-up visit. Patient educated on when it is appropriate to go to the emergency department.   Mary-Margaret Lunger, FNP

## 2024-11-08 NOTE — H&P (Signed)
 [facility] History and Physical  Reason for Referral: Acute appendicitis Referring Physician: Lynwood Roosevelt, PA-C  Chief Complaint   Abdominal Pain     Victor [name] is a 23 y.o. male.  HPI: Patient presents to the emergency department with an outpatient CT demonstrating acute appendicitis.  He states that starting this morning, he had generalized abdominal pain in the periumbilical region.  He went back to sleep for a little while, but when he woke up the pain was still present.  He presented to his primary care doctor's office, who ordered an outpatient CT scan.  He denies any nausea or vomiting associated with his episode.  He denies fevers and chills.  He last had food last night.  He denies any significant past medical history and is not currently using any blood thinning medications.  He denies any abdominal surgeries.  Outpatient CT scan demonstrates acute appendicitis with an appendicolith.  Past Medical History:  Diagnosis Date   Asthma     No past surgical history on file.  No family history on file.  Social History[1]  Medications: I have reviewed the patient's current medications. Allergies as of 11/08/2024   No Known Allergies     ROS:  Pertinent items are noted in HPI.  Blood pressure (!) 151/84, pulse 98, temperature 99.5 F (37.5 C), temperature source Oral, resp. rate 20, height 6' (1.829 m), weight 72.6 kg, SpO2 100%. Physical Exam Vitals reviewed.  Constitutional:      Appearance: He is well-developed.  HENT:     Head: Normocephalic and atraumatic.  Eyes:     Extraocular Movements: Extraocular movements intact.     Pupils: Pupils are equal, round, and reactive to light.  Cardiovascular:     Rate and Rhythm: Normal rate.  Pulmonary:     Effort: Pulmonary effort is normal.  Abdominal:     Comments: Abdomen soft, nondistended, no percussion tenderness, tenderness to palpation in the right lower quadrant; no rigidity, guarding,  rebound tenderness  Skin:    General: Skin is warm and dry.  Neurological:     General: No focal deficit present.     Mental Status: He is alert and oriented to person, place, and time.     Results: No results found for this or any previous visit (from the past 48 hours).  CT ABDOMEN PELVIS W CONTRAST Result Date: 11/08/2024 CLINICAL DATA:  Acute right lower quadrant abdominal pain. EXAM: CT ABDOMEN AND PELVIS WITH CONTRAST TECHNIQUE: Multidetector CT imaging of the abdomen and pelvis was performed using the standard protocol following bolus administration of intravenous contrast. RADIATION DOSE REDUCTION: This exam was performed according to the departmental dose-optimization program which includes automated exposure control, adjustment of the mA and/or kV according to patient size and/or use of iterative reconstruction technique. CONTRAST:  100mL OMNIPAQUE IOHEXOL 300 MG/ML  SOLN COMPARISON:  None Available. FINDINGS: Lower chest: No acute abnormality. Hepatobiliary: No focal liver abnormality is seen. No gallstones, gallbladder wall thickening, or biliary dilatation. Pancreas: Unremarkable. No pancreatic ductal dilatation or surrounding inflammatory changes. Spleen: Normal in size without focal abnormality. Adrenals/Urinary Tract: Adrenal glands are unremarkable. Kidneys are normal, without renal calculi, focal lesion, or hydronephrosis. Bladder is unremarkable. Stomach/Bowel: The stomach is unremarkable. There is no evidence of bowel obstruction. The appendix is thickened at 10 mm with surrounding inflammation consistent with acute appendicitis. Appendicolith is noted. Vascular/Lymphatic: No significant vascular findings are present. No enlarged abdominal or pelvic lymph nodes. Reproductive: Prostate is unremarkable. Other: No abdominal wall  hernia or abnormality. No abdominopelvic ascites. Musculoskeletal: No acute or significant osseous findings. IMPRESSION: Findings consistent with acute  appendicitis. These results will be called to the ordering clinician or representative by the Radiologist Assistant, and communication documented in the PACS or zVision Dashboard. Electronically Signed   By: Lynwood Landy Raddle M.D.   On: 11/08/2024 10:39     Assessment & Plan:  Victor [name] is a 24 y.o. male who presented to the emergency department with an outpatient CT scan demonstrating acute appendicitis.  Imaging evaluated by myself.  -We discussed the pathophysiology of appendicitis and are different treatment recommendations -Discussed the risk of laparoscopic appendectomy and the option of antibiotics alone. Discussed that in [place] and some trials in the [place] , antibiotics are used for simple appendicitis. Discussed that research shows a 40% failure rate for antibiotics alone.  Discussed risk of surgery including but not limited to bleeding, infection, injury to other organs, normal appendix, and after this discussion the patient has decided to proceed with robotic assisted laparoscopic appendectomy. -Blood work ordered in the emergency department.  Will make sure to draw blood work prior to proceeding to the operating room to have a preoperative WBC count -NPO -Rocephin and Flagyl -IVF -Further recommendations to follow surgery -Discussed with the patient possible discharge home later today versus tomorrow depending on how he is doing postoperatively and how his preoperative blood work appears  All questions were answered to the satisfaction of the patient and family.  Note: Portions of this report may have been transcribed using voice recognition software. [name] effort has been made to ensure accuracy; however, inadvertent computerized transcription errors may still be present.   Dorothyann Brittle, DO [facility] [address] [place], [place] [address] [phone] (office)         [1]  Social History Tobacco Use   Smoking status: Never    Smokeless tobacco: Never

## 2024-11-08 NOTE — Discharge Instructions (Signed)
Ambulatory Surgery Discharge Instructions  General Anesthesia or Sedation Do not drive or operate heavy machinery for 24 hours.  Do not consume alcohol, tranquilizers, sleeping medications, or any non-prescribed medications for 24 hours. Do not make important decisions or sign any important papers in the next 24 hours. You should have someone with you tonight at home.  Activity  You are advised to go directly home from the hospital.  Restrict your activities and rest for a day.  Resume light activity tomorrow. No heavy lifting over 10 lbs or strenuous exercise.  Fluids and Diet Regular diet  Medications  If you have not had a bowel movement in 24 hours, take 2 tablespoons over the counter Milk of mag.             You May resume your blood thinners tomorrow (Aspirin, coumadin, or other).  You are being discharged with prescriptions for Opioid/Narcotic Medications: There are some specific considerations for these medications that you should know. Opioid Meds have risks & benefits. Addiction to these meds is always a concern with prolonged use Take medication only as directed Do not drive while taking narcotic pain medication Do not crush tablets or capsules Do not use a different container than medication was dispensed in Lock the container of medication in a cool, dry place out of reach of children and pets. Opioid medication can cause addiction Do not share with anyone else (this is a felony) Do not store medications for future use. Dispose of them properly.     Disposal:  Find a [facility] household drug take back site near you.  If you can't get to a drug take back site, use the recipe below as a last resort to dispose of expired, unused or unwanted drugs. Disposal  (Do not dispose chemotherapy drugs this way, talk to your prescribing doctor instead.) Step 1: Mix drugs (do not crush) with dirt, kitty litter, or used coffee grounds and add a small amount of water to dissolve any  solid medications. Step 2: Seal drugs in plastic bag. Step 3: Place plastic bag in trash. Step 4: Take prescription container and scratch out personal information, then recycle or throw away.  Operative Site  You have a liquid bandage over your incisions, this will begin to flake off in about a week. Ok to shower tomorrow. Keep wound clean and dry. No baths or swimming. No lifting more than 10 pounds.  Contact Information: If you have questions or concerns, please call our office, [phone], Monday- Thursday 8AM-5PM and Friday 8AM-12Noon.  If it is after hours or on the weekend, please call [facility], [phone], and ask to speak to the surgeon on call for Dr. Brittnee Gaetano at [place].   SPECIFIC COMPLICATIONS TO WATCH FOR: Inability to urinate Fever over 101? F by mouth Nausea and vomiting lasting longer than 24 hours. Pain not relieved by medication ordered Swelling around the operative site Increased redness, warmth, hardness, around operative area Numbness, tingling, or cold fingers or toes Blood -soaked dressing, (small amounts of oozing may be normal) Increasing and progressive drainage from surgical area or exam site  

## 2024-11-08 NOTE — Telephone Encounter (Signed)
 FYI Only or Action Required?: FYI only for provider: appointment scheduled on 11/08/24.  Patient was last seen in primary care on 05/28/2024 by Zollie Lowers, MD.  Called Nurse Triage reporting Abdominal Pain.  Symptoms began yesterday.  Interventions attempted: Nothing.  Symptoms are: gradually worsening.  Triage Disposition: See HCP Within 4 Hours (Or PCP Triage)  Patient/caregiver understands and will follow disposition?: Yes                  Copied from CRM #[id number]. Topic: Clinical - Red Word Triage >> Nov 08, 2024  7:34 AM Donna BRAVO wrote: Red Word that prompted transfer to Nurse Triage:  woke up at midnight  pain along and  below the belly line Pain still there pain better when standing than sitting Reason for Disposition  [1] MILD-MODERATE pain AND [2] constant AND [3] present > 2 hours  Answer Assessment - Initial Assessment Questions Appt today with DOD. Recommended if sx worsen go to ED or call 911.    1. LOCATION: Where does it hurt?      Low abdominal pain per patient mother  2. RADIATION: Does the pain shoot anywhere else? (e.g., chest, back)     Na  3. ONSET: When did the pain begin? (Minutes, hours or days ago)      Last night  4. SUDDEN: Gradual or sudden onset?     After eating  5. PATTERN Does the pain come and go, or is it constant?    Constant  6. SEVERITY: How bad is the pain?  (e.g., Scale 1-10; mild, moderate, or severe)     Worsening moderate per patient mother not with patient now 7. RECURRENT SYMPTOM: Have you ever had this type of stomach pain before? If Yes, ask: When was the last time? and What happened that time?      Na  8. CAUSE: What do you think is causing the stomach pain? (e.g., gallstones, recent abdominal surgery)     Not sure , started after eating pizza 9. RELIEVING/AGGRAVATING FACTORS: What makes it better or worse? (e.g., antacids, bending or twisting motion, bowel movement)     Worse  sitting better standing but not standing straight  10. OTHER SYMPTOMS: Do you have any other symptoms? (e.g., back pain, diarrhea, fever, urination pain, vomiting)       Low abdominal pain , no fever, no N/V/D, no back pain  Protocols used: Abdominal Pain - Male-A-AH

## 2024-11-08 NOTE — Patient Instructions (Signed)
 Appendicitis, Adult  The appendix is a tube in the body that is shaped like a finger. It is attached to the large intestine. Appendicitis means that this tube is swollen (inflamed). If this is not treated, the tube can tear. This can lead to a life-threatening infection. This condition can also cause pus to build up in the appendix. What are the causes? Something blocking the appendix, such as: A ball of poop (stool). Lymph glands that are bigger than normal. Injury to the belly (abdomen). Sometimes the cause is not known. What increases the risk? You are more likely to get this condition if you are 71-20 years old. What are the signs or symptoms? Pain or tenderness that starts around the belly button and: Moves toward the lower right belly. Gets worse with time. Gets worse if you cough. Gets worse if you make a sudden move. Vomiting or feeling like you may vomit (nauseous). Not wanting to eat as much as normal (loss of appetite). A fever. Trouble pooping (constipation). Watery poop (diarrhea). Feel generally sick (malaise). How is this treated? In general, this condition is treated with both antibiotic medicines and surgery to take out the appendix. If only antibiotics are given and the appendix is not taken out, there is a chance the condition could come back. There are two ways to take out the appendix: Open surgery. For this method, the appendix is taken out through a large cut. This cut is called an incision and is made in the lower right belly. This surgery may be used if: You have scars from another surgery. You have a bleeding condition. You are pregnant and will be having your baby soon. You have a condition that makes it hard to do the other type of surgery. Laparoscopic surgery. For this method, the appendix is taken out through small cuts. Often, this surgery: Causes less pain. Causes fewer problems. Heals faster. If your appendix tears and pus forms: A drain may be  put into the sore. The drain will be used to get rid of the pus. You may get an antibiotic through an IV tube. Your appendix may or may not need to be taken out. Follow these instructions at home: If you had surgery: Follow instructions from your doctor on how to: Care for yourself at home. Take care of your cut from surgery. Medicines Take over-the-counter and prescription medicines only as told by your doctor. If you were prescribed an antibiotic medicine, take it as told by your doctor. Do not stop taking it even if you start to feel better. If told, take steps to prevent problems with pooping (constipation). You may need to: Drink enough fluid to keep your pee (urine) pale yellow. Take medicines. You will be told what medicines to take. Eat foods that are high in fiber. These include beans, whole grains, and fresh fruits and vegetables. Limit foods that are high in fat and sugar. These include fried or sweet foods. Ask your doctor if you should avoid driving or using machines while you are taking your medicine. General instructions Follow instructions from your doctor about what you cannot eat or drink. Do not smoke or use any products that contain nicotine or tobacco. If you need help quitting, ask your doctor. Return to your normal activities when your doctor says that it is safe. Keep all follow-up visits. Contact a doctor if: There is pus, blood, or a lot of fluid coming from your cut or cuts from surgery. You feel like you may  vomit, or you vomit. You have a fever. You are very tired. You have muscle pain. Get help right away if: You have pain in your belly, and the pain is getting worse. You are short of breath. You cannot stop vomiting. These symptoms may be an emergency. Get help right away. Call your local emergency services (911 in the [place] Do not wait to see if the symptoms will go away. Do not drive yourself to the hospital. Summary Appendicitis is swelling of  the appendix. The appendix is a tube that is shaped like a finger. It is joined to the large intestine. This condition may be caused by something that blocks the appendix. This can lead to an infection. This condition is most often treated with both antibiotic medicines and taking out the appendix. This information is not intended to replace advice given to you by your health care provider. Make sure you discuss any questions you have with your health care provider. Document Revised: 05/06/2021 Document Reviewed: 05/06/2021 Elsevier Patient Education  2024 [employer].

## 2024-11-08 NOTE — Anesthesia Procedure Notes (Addendum)
 Procedure Name: Intubation Date/Time: 11/08/2024 2:30 PM  Performed by: Toribio Darice BRAVO, CRNAPre-anesthesia Checklist: Patient identified, Patient being monitored, Timeout performed, Emergency Drugs available and Suction available Patient Re-evaluated:Patient Re-evaluated prior to induction Oxygen Delivery Method: Circle system utilized Preoxygenation: Pre-oxygenation with 100% oxygen Induction Type: IV induction Ventilation: Mask ventilation without difficulty Laryngoscope Size: Mac, 3, Miller and 2 Grade View: Grade II Tube type: Oral Tube size: 7.5 mm Number of attempts: 1 Airway Equipment and Method: Stylet Placement Confirmation: ETT inserted through vocal cords under direct vision, positive ETCO2 and breath sounds checked- equal and bilateral Secured at: 24 cm Tube secured with: Tape Dental Injury: Teeth and Oropharynx as per pre-operative assessment  Difficulty Due To: Difficult Airway- due to anterior larynx Comments: Intubation successful on second attempt with Cleotilde 2

## 2024-11-08 NOTE — Anesthesia Preprocedure Evaluation (Signed)
 Anesthesia Evaluation  Patient identified by MRN, date of birth, ID band Patient awake    Reviewed: Allergy & Precautions, H&P , NPO status , Patient's Chart, lab work & pertinent test results, reviewed documented beta blocker date and time   Airway Mallampati: II  TM Distance: >3 FB Neck ROM: full    Dental no notable dental hx.    Pulmonary asthma    Pulmonary exam normal breath sounds clear to auscultation       Cardiovascular Exercise Tolerance: Good hypertension, negative cardio ROS  Rhythm:regular Rate:Normal     Neuro/Psych negative neurological ROS  negative psych ROS   GI/Hepatic Neg liver ROS,GERD  ,,  Endo/Other  negative endocrine ROS    Renal/GU negative Renal ROS  negative genitourinary   Musculoskeletal   Abdominal   Peds  Hematology negative hematology ROS (+)   Anesthesia Other Findings   Reproductive/Obstetrics negative OB ROS                              Anesthesia Physical Anesthesia Plan  ASA: 2  Anesthesia Plan: General and General ETT   Post-op Pain Management:    Induction:   PONV Risk Score and Plan: Ondansetron   Airway Management Planned:   Additional Equipment:   Intra-op Plan:   Post-operative Plan:   Informed Consent: I have reviewed the patients History and Physical, chart, labs and discussed the procedure including the risks, benefits and alternatives for the proposed anesthesia with the patient or authorized representative who has indicated his/her understanding and acceptance.     Dental Advisory Given  Plan Discussed with: CRNA  Anesthesia Plan Comments:         Anesthesia Quick Evaluation

## 2024-11-08 NOTE — Plan of Care (Signed)

## 2024-11-08 NOTE — Progress Notes (Signed)
 [facility]  Spoke with the patient's family in the consultation room.  I explained that he tolerated the procedure without difficulty.  He has dissolvable stitches under the skin with overlying skin glue.  This will flake off in 10 to 14 days.   He will be discharged home with a prescription for narcotic pain medication that they should take as needed for pain.  I also want him taking scheduled Tylenol .  If they take the narcotic pain medication, they should take a stool softener as well.  The patient will follow-up with me in 2 weeks for phone follow-up.  We will see how he does with a diet postoperatively to determine if he will stay overnight vs discharge home tomorrow.  All questions were answered to their expressed satisfaction.  Plan: -Admit to med-surg floor -Regular diet -No further antibiotics required -PRN pain control and antiemetics -Will see how the patient tolerates diet.  If he tolerates diet and pain is controlled with oral medications, possible discharge home today vs tomorrow -SCDs, lovenox  Dorothyann Brittle, DO [facility] [address] [place], [place] [address] [phone] (office)

## 2024-11-08 NOTE — Op Note (Signed)
 [facility] Operative Note  Preoperative diagnosis: Acute appendicitis  Postoperative diagnosis: Same  Procedure: Robotic assisted laparoscopic appendectomy.  Anesthesia: General   Surgeon: Dorothyann Brittle, DO  Wound Classification: contaminated  Specimen: Appendix  Complications: None  Estimated Blood Loss: minimal  Indications: Patient is a 23 y.o. male presented with outpatient CT scan demonstrating acute appendicitis.  He presented to the ED for evaluation. The risk of surgery were explained to the patient including but not limited to bleeding, infection, finding a rupture, injury to other organs, needing to do an open procedure, and development of intra-abdominal abscess.  Patient is agreeable to surgery and both written and verbal consent were obtained.  FIndings: Acute appendicitis Upon entering the abdomen (organ space), I encountered a phlegmon involving the appendix. Staple line along the base of the appendix was noted to be intact Hemostasis was noted at the completion of the case  Description of procedure: The patient was placed on the operating table in the supine position, left arm tucked. General anesthesia was induced. A time-out was completed verifying correct patient, procedure, site, positioning, and implant(s) and/or special equipment prior to beginning this procedure. The abdomen was prepped and draped in the usual sterile fashion.   At Palmer's point, an incision was made and Veress needle was inserted.  After confirming intraabdominal location with positive saline drop test and low insufflation pressures, gas insufflation was initiated until the abdominal pressure was measured at 15 mmHg.  Afterwards, the Veress needle was removed and a 8 mm port was placed through the Palmer's point site using Optiview technique. No injuries were noted.  Two additional incisions were made 8 cm apart along the left side of the abdominal wall from the  initial incision.  8 mm ports were then placed under direct visualization.  No injuries from trocar placements were noted. The table flexed and was placed in the Trendelenburg position with the right side elevated.  Xi robotic platform was then brought to the operative field and docked. A vessel sealer was placed through the Palmer's point port and a forced bipolar through the lower 8 mm port.   Upon entering the abdomen (organ space), I encountered a phlegmon involving the appendix. An appendix, that appeared inflamed, was identified and elevated.  Infection was present within the abdominal cavity due to appendicitis.  The vessel sealer was used to ligate the mesoappendix.  A white load linear cutting stapler was placed through the Palmer's point port, and then used to divide and staple the base of the appendix.  No bleeding from the staple line was noted.  The appendix was placed in an endoscopic retrieval bag and removed through the Palmer's point port.   The appendiceal stump staple line was examined again and hemostasis noted. No other pathology was identified within pelvis. The Palmer's point trocar removed and port site closed with PMI using 0 vicryl under direct vision. Remaining trocars were removed. No bleeding was noted.  The abdomen was allowed to collapse.  Marcaine was instilled at the incision sites.  All skin incisions then closed with subcuticular sutures Monocryl 4-0.  Wounds then dressed with dermabond.  The patient tolerated the procedure well, awakened from anesthesia and was taken to the postanesthesia care unit in satisfactory condition.  Sponge count and instrument count correct at the end of the procedure.  Dorothyann Brittle, DO [facility] [address] [place], [place] [address] [phone] (office)

## 2024-11-08 NOTE — ED Notes (Signed)
 Report given to OR staff. Pt aware and informed of surgery. Parents with pt

## 2024-11-08 NOTE — ED Provider Notes (Signed)
 [place] EMERGENCY DEPARTMENT AT [facility] Provider Note   CSN: [id number] Arrival date & time: 11/08/24  1147     Patient presents with: Abdominal Pain   Victor [name] is a 23 y.o. male otherwise healthy presents with complaints of abdominal pain that started last night.  Pain is constant.  Not associate with any nausea, vomiting or diarrhea.  No urinary symptoms.  No prior abdominal surgeries.  Had a outpatient CT scan that was consistent with acute appendicitis.  Referred here for further evaluation.    Abdominal Pain     Past Medical History:  Diagnosis Date   Asthma    History reviewed. No pertinent surgical history.   Prior to Admission medications  Not on File    Allergies: Patient has no known allergies.    Review of Systems  Gastrointestinal:  Positive for abdominal pain.    Updated Vital Signs BP (!) 151/84 (BP Location: Right Arm)   Pulse 98   Temp 99.5 F (37.5 C) (Oral)   Resp 20   Ht 6' (1.829 m)   Wt 72.6 kg   SpO2 100%   BMI 21.70 kg/m   Physical Exam Vitals and nursing note reviewed.  Constitutional:      General: He is not in acute distress.    Appearance: He is well-developed.  HENT:     Head: Normocephalic and atraumatic.  Eyes:     Conjunctiva/sclera: Conjunctivae normal.  Cardiovascular:     Rate and Rhythm: Normal rate and regular rhythm.     Heart sounds: No murmur heard. Pulmonary:     Effort: Pulmonary effort is normal. No respiratory distress.     Breath sounds: Normal breath sounds.  Abdominal:     Palpations: Abdomen is soft.     Tenderness: There is abdominal tenderness.     Comments: Tenderness to right lower quadrant, soft nondistended  Musculoskeletal:        General: No swelling.     Cervical back: Neck supple.  Skin:    General: Skin is warm and dry.     Capillary Refill: Capillary refill takes less than 2 seconds.  Neurological:     Mental Status: He is alert.  Psychiatric:        Mood  and Affect: Mood normal.     (all labs ordered are listed, but only abnormal results are displayed) Labs Reviewed  CBC WITH DIFFERENTIAL/PLATELET  COMPREHENSIVE METABOLIC PANEL WITH GFR  LIPASE, BLOOD  URINALYSIS, ROUTINE W REFLEX MICROSCOPIC    EKG: None  Radiology: CT ABDOMEN PELVIS W CONTRAST Result Date: 11/08/2024 CLINICAL DATA:  Acute right lower quadrant abdominal pain. EXAM: CT ABDOMEN AND PELVIS WITH CONTRAST TECHNIQUE: Multidetector CT imaging of the abdomen and pelvis was performed using the standard protocol following bolus administration of intravenous contrast. RADIATION DOSE REDUCTION: This exam was performed according to the departmental dose-optimization program which includes automated exposure control, adjustment of the mA and/or kV according to patient size and/or use of iterative reconstruction technique. CONTRAST:  OMNIPAQUE  IOHEXOL  300 MG/ML  SOLN COMPARISON:  None Available. FINDINGS: Lower chest: No acute abnormality. Hepatobiliary: No focal liver abnormality is seen. No gallstones, gallbladder wall thickening, or biliary dilatation. Pancreas: Unremarkable. No pancreatic ductal dilatation or surrounding inflammatory changes. Spleen: Normal in size without focal abnormality. Adrenals/Urinary Tract: Adrenal glands are unremarkable. Kidneys are normal, without renal calculi, focal lesion, or hydronephrosis. Bladder is unremarkable. Stomach/Bowel: The stomach is unremarkable. There is no evidence of bowel obstruction. The appendix  is thickened at 10 mm with surrounding inflammation consistent with acute appendicitis. Appendicolith is noted. Vascular/Lymphatic: No significant vascular findings are present. No enlarged abdominal or pelvic lymph nodes. Reproductive: Prostate is unremarkable. Other: No abdominal wall hernia or abnormality. No abdominopelvic ascites. Musculoskeletal: No acute or significant osseous findings. IMPRESSION: Findings consistent with acute  appendicitis. These results will be called to the ordering clinician or representative by the Radiologist Assistant, and communication documented in the PACS or zVision Dashboard. Electronically Signed   By: Lynwood Landy Raddle M.D.   On: 11/08/2024 10:39     .Critical Care  Performed by: Donnajean Lynwood DEL, PA-C Authorized by: Donnajean Lynwood DEL, PA-C   Critical care provider statement:    Critical care time (minutes):  30   Critical care was time spent personally by me on the following activities:  Development of treatment plan with patient or surrogate, discussions with consultants, evaluation of patient's response to treatment, examination of patient, ordering and review of laboratory studies, ordering and review of radiographic studies, ordering and performing treatments and interventions, pulse oximetry, re-evaluation of patient's condition and review of old charts    Medications Ordered in the ED  cefTRIAXone  (ROCEPHIN ) 2 g in sodium chloride  0.9 % 100 mL IVPB (has no administration in time range)    And  metroNIDAZOLE  (FLAGYL ) IVPB 500 mg (has no administration in time range)    Clinical Course as of 11/08/24 1333  Fri Nov 08, 2024  1307 Patient evaluated for complaints of abdominal pain that started last night.  Had outpatient CT scan consistent with acute appendicitis.  Upon arrival patient is hypertensive otherwise hemodynamically stable.  On exam he has tenderness to the right lower quadrant, no peritoneal signs.  General surgery consulted.  Labs and antibiotics ordered [JT]  1322 Discussed with general surgery Dr. Evonnie, agreed for admission for appendectomy today. [JT]    Clinical Course User Index [JT] Donnajean Lynwood DEL, PA-C                                 Medical Decision Making Risk Prescription drug management.   This patient presents to the ED with chief complaint(s) of Abdominal pain .  The complaint involves an extensive differential diagnosis and also  carries with it a high risk of complications and morbidity.   Pertinent past medical history as listed in HPI  The differential diagnosis includes  Appendicitis, cholecystitis, gastroenteritis, UTI, pyelonephritis, nephrolithiasis Additional history obtained: Records reviewed Care Everywhere/External Records  Disposition:   Patient be admitted for further management of appendicitis  Social Determinants of Health:   none  This note was dictated with voice recognition software.  Despite best efforts at proofreading, errors may have occurred which can change the documentation meaning.       Final diagnoses:  Acute appendicitis, unspecified acute appendicitis type    ED Discharge Orders     None          Donnajean Lynwood DEL DEVONNA 11/08/24 1333    Zammit, Joseph, MD 11/09/24 [phone]

## 2024-11-09 LAB — CBC
HCT: 39.7 % (ref 39.0–52.0)
Hemoglobin: 13.9 g/dL (ref 13.0–17.0)
MCH: 30.3 pg (ref 26.0–34.0)
MCHC: 35 g/dL (ref 30.0–36.0)
MCV: 86.7 fL (ref 80.0–100.0)
Platelets: 226 K/uL (ref 150–400)
RBC: 4.58 MIL/uL (ref 4.22–5.81)
RDW: 11.9 % (ref 11.5–15.5)
WBC: 11.8 K/uL — ABNORMAL HIGH (ref 4.0–10.5)
nRBC: 0 % (ref 0.0–0.2)

## 2024-11-09 LAB — HIV ANTIBODY (ROUTINE TESTING W REFLEX): HIV Screen 4th Generation wRfx: NONREACTIVE

## 2024-11-09 NOTE — Plan of Care (Signed)

## 2024-11-09 NOTE — Progress Notes (Signed)
 Removed IV-CDI. Reviewed d/c paperwork with patient and family. Answered questions. Wheeled stable patient and belongings to main entrance where he was picked up by his father to d/c to home.

## 2024-11-09 NOTE — Discharge Summary (Signed)
 Physician Discharge Summary  Patient ID: Victor [name] MRN: [id number] DOB/AGE: 2001/09/27 23 y.o.  Admit date: 11/08/2024 Discharge date: 11/09/2024  Admission Diagnoses: Acute appendicitis  Discharge Diagnoses:  Principal Problem:   Acute appendicitis   Discharged Condition: stable  Hospital Course: Patient is a 23 year old male who presented to the emergency department with an outpatient CT scan demonstrating acute appendicitis with appendicolith.  He is status post robotic assisted laparoscopic appendectomy on 12/12.  Postoperatively, the patient has done well.  He has been tolerating a diet without nausea and vomiting, pain is controlled with oral medications, and he is passing flatus.  His leukocytosis is stable this morning, and likely a reaction to his recent surgery.  He is stable for discharge home at this time.  He will be discharged home with prescriptions for oxycodone , Tylenol , Colace, and Zofran .  I will have a phone follow-up with him in 2 weeks.  Consults: None  Significant Diagnostic Studies: labs: WBC 11.3 and radiology: CT scan: Acute appendicitis with appendicolith  Treatments: IV hydration, antibiotics: ceftriaxone  and metronidazole , analgesia: acetaminophen , Morphine , and oxycodone , and surgery: Robotic assisted laparoscopic appendectomy  Discharge Exam: Blood pressure 119/61, pulse 90, temperature (!) 97.5 F (36.4 C), temperature source Oral, resp. rate 20, height 6' (1.829 m), weight 72.6 kg, SpO2 99%. General appearance: alert, cooperative, and no distress GI: Abdomen soft, nondistended, no percussion tenderness, minimal incisional tenderness to palpation; no rigidity, guarding, rebound tenderness; laparoscopic incision sites C/D/I with Dermabond in place  Disposition: Discharge disposition: 01-Home or Self Care       Discharge Instructions     Call MD for:  persistant nausea and vomiting   Complete by: As directed    Call MD for:  redness,  tenderness, or signs of infection (pain, swelling, redness, odor or green/yellow discharge around incision site)   Complete by: As directed    Call MD for:  severe uncontrolled pain   Complete by: As directed    Call MD for:  temperature >100.4   Complete by: As directed    Increase activity slowly   Complete by: As directed       Allergies as of 11/09/2024   No Known Allergies      Medication List     TAKE these medications    acetaminophen  500 MG tablet Commonly known as: TYLENOL  Take 2 tablets (1,000 mg total) by mouth every 6 (six) hours for 7 days.   docusate sodium  100 MG capsule Commonly known as: Colace Take 1 capsule (100 mg total) by mouth 2 (two) times daily.   ondansetron  4 MG tablet Commonly known as: Zofran  Take 1 tablet (4 mg total) by mouth daily as needed for nausea or vomiting.   oxyCODONE  5 MG immediate release tablet Commonly known as: Roxicodone  Take 1 tablet (5 mg total) by mouth every 6 (six) hours as needed.        Follow-up Information     Dayden Viverette, Dorothyann LABOR, DO. Call.   Specialty: General Surgery Why: I will call you in 2-3 weeks for a phone follow up Contact information: [address] [place] [facility] [address] [phone]                 Signed: Yosiah Jasmin A Onalee Steinbach 11/09/2024, 12:05 PM

## 2024-11-10 LAB — URINE CULTURE

## 2024-11-11 ENCOUNTER — Encounter (HOSPITAL_COMMUNITY): Payer: Self-pay | Admitting: Surgery

## 2024-11-12 LAB — SURGICAL PATHOLOGY

## 2024-11-14 NOTE — Anesthesia Postprocedure Evaluation (Signed)
 Anesthesia Post Note  Patient: Victor [name]  Procedure(s) Performed: APPENDECTOMY, ROBOT-ASSISTED, LAPAROSCOPIC (Abdomen)  Patient location during evaluation: Phase II Anesthesia Type: General Level of consciousness: awake Pain management: pain level controlled Vital Signs Assessment: post-procedure vital signs reviewed and stable Respiratory status: spontaneous breathing and respiratory function stable Cardiovascular status: blood pressure returned to baseline and stable Postop Assessment: no headache and no apparent nausea or vomiting Anesthetic complications: no Comments: Late entry   No notable events documented.   Last Vitals:  Vitals:   11/09/24 0030 11/09/24 0542  BP: 133/66 119/61  Pulse: 88 90  Resp: (!) 21 20  Temp: 36.6 C (!) 36.4 C  SpO2: 97% 99%    Last Pain:  Vitals:   11/09/24 1228  TempSrc:   PainSc: 1                  Yvonna JINNY Bosworth

## 2024-11-18 ENCOUNTER — Ambulatory Visit (INDEPENDENT_AMBULATORY_CARE_PROVIDER_SITE_OTHER): Admitting: Family

## 2024-11-18 ENCOUNTER — Encounter: Payer: Self-pay | Admitting: Family

## 2024-11-18 VITALS — BP 130/63 | HR 79 | Temp 96.4°F | Ht 72.0 in | Wt 161.0 lb

## 2024-11-18 DIAGNOSIS — G44209 Tension-type headache, unspecified, not intractable: Secondary | ICD-10-CM

## 2024-11-18 DIAGNOSIS — Z0000 Encounter for general adult medical examination without abnormal findings: Secondary | ICD-10-CM | POA: Diagnosis not present

## 2024-11-18 DIAGNOSIS — R17 Unspecified jaundice: Secondary | ICD-10-CM | POA: Diagnosis not present

## 2024-11-18 NOTE — Patient Instructions (Signed)
 Bilirubin Test Why am I having this test? The bilirubin test is used to evaluate liver function. A health care provider may recommend this test: For a newborn who has jaundice. For an adult who has jaundice. If you have hemolytic anemia. What is being tested? This test measures the level of bilirubin in the body. Bilirubin is produced when red blood cells are broken down. Normally, bilirubin is broken down in the liver and eliminated from the blood (excreted) as a component of bile. However, when red blood cells are broken down more quickly than usual, or when there is a problem in how bile is excreted, bilirubin levels can become raised (elevated). In newborns with jaundice, elevated bilirubin levels may put the child at risk for brain damage. What kind of sample is taken? This test can be performed using one of the following methods: Blood sample. This is usually collected by inserting a needle into a blood vessel. Urine sample. This is collected using a germ-free (sterile) container that is given to you by the lab. How do I prepare for this test? Fasting requirements for this test may vary among different labs. You may be asked not to eat or drink anything except water after midnight on the night before the test. Follow instructions from your health care provider about eating or drinking restrictions. How are the results reported? Your test results will be reported as values. Your health care provider will compare your results to normal ranges that were established after testing a large group of people (reference ranges). Reference ranges may vary among labs and hospitals. For this test, common reference ranges are: Blood samples Newborn total bilirubin: 1-12 mg/dL or 62.9-528 micromoles/L (SI units). Child, adult, and adult aged 77 or older: Total bilirubin: 0.3-1 mg/dL or 4.1-32 micromoles/L (SI units). Indirect bilirubin: 0.2-0.8 mg/dL or 4.4-01 micromoles/L (SI units). Direct bilirubin:  0.1-0.3 mg/dL or 0.2-7.2 micromoles/L (SI units). Urine samples 0-0.02 mg/dL or 5-3.66 micromoles/L (SI units). What do the results mean? Results that are greater than the reference ranges may indicate: Gallstones or obstruction of the bile ducts. Certain tumors of the liver. Disorders that affect the breakdown and excretion of bilirubin. Disorders that cause the destruction of red blood cells. Liver diseases. Reaction to certain medicines. Reaction to blood transfusion. Talk with your health care provider about what your results mean. Questions to ask your health care provider Ask your health care provider, or the department that is doing the test: When will my results be ready? How will I get my results? What are my treatment options? What other tests do I need? What are my next steps? Summary The bilirubin test is used to evaluate liver function. Bilirubin is produced when red blood cells are broken down. When red blood cells are broken down more quickly than usual, or when there is a problem with how bile is excreted, bilirubin levels can become elevated. Results that are greater than the reference ranges may indicate a number of diseases. This information is not intended to replace advice given to you by your health care provider. Make sure you discuss any questions you have with your health care provider. Document Revised: 02/25/2021 Document Reviewed: 02/25/2021 Elsevier Patient Education  2024 [employer].

## 2024-11-18 NOTE — Progress Notes (Signed)
 "  Subjective:    Patient ID: Victor [name], male    DOB: Jun 04, 2001, 23 y.o.   MRN: [id number]  Chief Complaint  Patient presents with   Medical Management of Chronic Issues    Pt presents to the office today for CPE. He is currently not taking any medications. Pt denies any palpitations, SOB, or edema at this time.   Had an appendectomy  11/08/24. Headache  This is a new problem. The current episode started more than 1 month ago. The problem occurs intermittently. The problem has been waxing and waning (usually at work). The pain is located in the Frontal region. The pain quality is similar to prior headaches. The quality of the pain is described as aching. The pain is at a severity of 6/10. The pain is moderate. Pertinent negatives include no coughing, dizziness, drainage, ear pain, eye pain, fever, hearing loss, nausea, phonophobia, photophobia, seizures, sinus pressure, tinnitus, vomiting, weakness or weight loss. The symptoms are aggravated by emotional stress and noise. He has tried acetaminophen  and NSAIDs for the symptoms. The treatment provided mild relief.      Review of Systems  Constitutional:  Negative for fever and weight loss.  HENT:  Negative for ear pain, hearing loss, sinus pressure and tinnitus.   Eyes:  Negative for photophobia and pain.  Respiratory:  Negative for cough.   Gastrointestinal:  Negative for nausea and vomiting.  Neurological:  Positive for headaches. Negative for dizziness, seizures and weakness.  All other systems reviewed and are negative.  History reviewed. No pertinent family history. Social History   Socioeconomic History   Marital status: Single    Spouse name: Not on file   Number of children: Not on file   Years of education: Not on file   Highest education level: 12th grade  Occupational History   Not on file  Tobacco Use   Smoking status: Never   Smokeless tobacco: Never  Substance and Sexual Activity   Alcohol use: Not  Currently   Drug use: Not Currently   Sexual activity: Not on file  Other Topics Concern   Not on file  Social History Narrative   Not on file   Social Drivers of Health   Tobacco Use: Low Risk (11/18/2024)   Patient History    Smoking Tobacco Use: Never    Smokeless Tobacco Use: Never    Passive Exposure: Not on file  Financial Resource Strain: Low Risk (11/08/2024)   Overall Financial Resource Strain (CARDIA)    Difficulty of Paying Living Expenses: Not hard at all  Food Insecurity: No Food Insecurity (11/08/2024)   Epic    Worried About Radiation Protection Practitioner of Food in the Last Year: Never true    Ran Out of Food in the Last Year: Never true  Transportation Needs: No Transportation Needs (11/08/2024)   Epic    Lack of Transportation (Medical): No    Lack of Transportation (Non-Medical): No  Physical Activity: Insufficiently Active (11/08/2024)   Exercise Vital Sign    Days of Exercise per Week: 5 days    Minutes of Exercise per Session: 20 min  Stress: No Stress Concern Present (11/08/2024)   [employer] of Occupational Health - Occupational Stress Questionnaire    Feeling of Stress: Not at all  Social Connections: Moderately Isolated (11/08/2024)   Social Connection and Isolation Panel    Frequency of Communication with Friends and Family: More than three times a week    Frequency of Social Gatherings with Friends and  Family: More than three times a week    Attends Religious Services: 1 to 4 times per year    Active Member of Clubs or Organizations: No    Attends Banker Meetings: Not on file    Marital Status: Never married  Depression (PHQ2-9): Low Risk (11/18/2024)   Depression (PHQ2-9)    PHQ-2 Score: 0  Alcohol Screen: Low Risk (05/28/2024)   Alcohol Screen    Last Alcohol Screening Score (AUDIT): 1  Housing: Low Risk (11/08/2024)   Epic    Unable to Pay for Housing in the Last Year: No    Number of Times Moved in the Last Year: 0    Homeless in the  Last Year: No  Utilities: Not At Risk (11/08/2024)   Epic    Threatened with loss of utilities: No  Health Literacy: Not on file       Objective:   Physical Exam Vitals reviewed.  Constitutional:      General: He is not in acute distress.    Appearance: He is well-developed.  HENT:     Head: Normocephalic.     Right Ear: Tympanic membrane and external ear normal.     Left Ear: Tympanic membrane and external ear normal.  Eyes:     General:        Right eye: No discharge.        Left eye: No discharge.     Pupils: Pupils are equal, round, and reactive to light.  Neck:     Thyroid : No thyromegaly.  Cardiovascular:     Rate and Rhythm: Normal rate and regular rhythm.     Heart sounds: Normal heart sounds. No murmur heard. Pulmonary:     Effort: Pulmonary effort is normal. No respiratory distress.     Breath sounds: Normal breath sounds. No wheezing.  Abdominal:     General: Bowel sounds are normal. There is no distension.     Palpations: Abdomen is soft.     Tenderness: There is no abdominal tenderness.  Musculoskeletal:        General: No tenderness. Normal range of motion.     Cervical back: Normal range of motion and neck supple.  Skin:    General: Skin is warm and dry.     Findings: No erythema or rash.  Neurological:     Mental Status: He is alert and oriented to person, place, and time.     Cranial Nerves: No cranial nerve deficit.     Deep Tendon Reflexes: Reflexes are normal and symmetric.  Psychiatric:        Behavior: Behavior normal.        Thought Content: Thought content normal.        Judgment: Judgment normal.          BP 130/63   Pulse 79   Temp (!) 96.4 F (35.8 C) (Temporal)   Ht 6' (1.829 m)   Wt 161 lb (73 kg)   SpO2 93%   BMI 21.84 kg/m   Assessment & Plan:  Victor [name] comes in today with chief complaint of Medical Management of Chronic Issues   Diagnosis and orders addressed:  1. Annual physical exam (Primary) -  CMP14+EGFR - CBC with Differential/Platelet  2. Elevated bilirubin Could be Gibert syndrome? Has had elevation for 4+ years.  - CMP14+EGFR - CBC with Differential/Platelet  3. Acute non intractable tension-type headache Stress management   Labs pending Health Maintenance reviewed Diet and exercise encouraged  Follow  up plan: 1 year   Bari Learn, FNP    "

## 2024-11-19 ENCOUNTER — Ambulatory Visit: Payer: Self-pay | Admitting: Family

## 2024-11-19 LAB — CMP14+EGFR
ALT: 33 IU/L (ref 0–44)
AST: 21 IU/L (ref 0–40)
Albumin: 4.6 g/dL (ref 4.3–5.2)
Alkaline Phosphatase: 56 IU/L (ref 47–123)
BUN/Creatinine Ratio: 15 (ref 9–20)
BUN: 14 mg/dL (ref 6–20)
Bilirubin Total: 1 mg/dL (ref 0.0–1.2)
CO2: 25 mmol/L (ref 20–29)
Calcium: 9.7 mg/dL (ref 8.7–10.2)
Chloride: 101 mmol/L (ref 96–106)
Creatinine, Ser: 0.94 mg/dL (ref 0.76–1.27)
Globulin, Total: 2.3 g/dL (ref 1.5–4.5)
Glucose: 67 mg/dL — AB (ref 70–99)
Potassium: 4.2 mmol/L (ref 3.5–5.2)
Sodium: 140 mmol/L (ref 134–144)
Total Protein: 6.9 g/dL (ref 6.0–8.5)
eGFR: 118 mL/min/1.73

## 2024-11-19 LAB — CBC WITH DIFFERENTIAL/PLATELET
Basophils Absolute: 0.1 x10E3/uL (ref 0.0–0.2)
Basos: 1 %
EOS (ABSOLUTE): 0.3 x10E3/uL (ref 0.0–0.4)
Eos: 5 %
Hematocrit: 47.5 % (ref 37.5–51.0)
Hemoglobin: 16 g/dL (ref 13.0–17.7)
Immature Grans (Abs): 0 x10E3/uL (ref 0.0–0.1)
Immature Granulocytes: 0 %
Lymphocytes Absolute: 1.7 x10E3/uL (ref 0.7–3.1)
Lymphs: 28 %
MCH: 30.6 pg (ref 26.6–33.0)
MCHC: 33.7 g/dL (ref 31.5–35.7)
MCV: 91 fL (ref 79–97)
Monocytes Absolute: 0.7 x10E3/uL (ref 0.1–0.9)
Monocytes: 11 %
Neutrophils Absolute: 3.4 x10E3/uL (ref 1.4–7.0)
Neutrophils: 55 %
Platelets: 244 x10E3/uL (ref 150–450)
RBC: 5.23 x10E6/uL (ref 4.14–5.80)
RDW: 12.7 % (ref 11.6–15.4)
WBC: 6.1 x10E3/uL (ref 3.4–10.8)

## 2024-11-26 ENCOUNTER — Ambulatory Visit (INDEPENDENT_AMBULATORY_CARE_PROVIDER_SITE_OTHER): Admitting: Surgery

## 2024-11-26 DIAGNOSIS — Z09 Encounter for follow-up examination after completed treatment for conditions other than malignant neoplasm: Secondary | ICD-10-CM

## 2024-11-26 NOTE — Progress Notes (Signed)
 [facility]  I am calling the patient for post operative evaluation. This is not a billable encounter as it is under the global charges for the surgery.  The patient had a robotic assisted laparoscopic appendectomy on 12/12.  The patient did not answer.  Voicemail left.  Pathology: A. APPENDIX, APPENDECTOMY:  - Acute appendicitis   Will see the patient PRN.   Dorothyann Brittle, DO [facility] [address] [place], [place] [address] [phone] (office)

## 2024-11-26 NOTE — Progress Notes (Signed)
 Patient returned call.   Reports that he is healing well. No distress noted.   Reviewed pathology results. Verbalized understanding.   Advised to contact office as needed.

## 2025-01-03 ENCOUNTER — Ambulatory Visit: Admitting: Nurse Practitioner

## 2025-01-03 ENCOUNTER — Encounter: Payer: Self-pay | Admitting: Nurse Practitioner

## 2025-01-03 VITALS — BP 124/72 | HR 76 | Temp 97.7°F | Ht 72.0 in | Wt 165.0 lb

## 2025-01-03 DIAGNOSIS — J029 Acute pharyngitis, unspecified: Secondary | ICD-10-CM

## 2025-01-03 NOTE — Patient Instructions (Signed)
Force fluids °Motrin or tylenol OTC °OTC decongestant °Throat lozenges if help °New toothbrush in 3 days ° °

## 2025-01-03 NOTE — Progress Notes (Signed)
" ° °  Subjective:    Patient ID: Victor [name], male    DOB: Mar 08, 2001, 24 y.o.   MRN: [id number]   Chief Complaint: Sore Throat and head congestion   Sore Throat  This is a new problem. The current episode started in the past 7 days. The problem has been gradually improving. Neither side of throat is experiencing more pain than the other. There has been no fever. Associated symptoms include congestion, coughing and trouble swallowing (much better today). He has tried acetaminophen  (tylenol  severe cold and flu) for the symptoms. The treatment provided mild relief.    Patient Active Problem List   Diagnosis Date Noted   Elevated bilirubin 11/18/2024   Acute appendicitis 11/08/2024   Gastroesophageal reflux disease without esophagitis 04/29/2019       Review of Systems  Constitutional:  Negative for chills and fever.  HENT:  Positive for congestion and trouble swallowing (much better today).   Respiratory:  Positive for cough.        Objective:   Physical Exam Constitutional:      Appearance: Normal appearance. He is well-developed.  Cardiovascular:     Rate and Rhythm: Normal rate and regular rhythm.  Pulmonary:     Breath sounds: Normal breath sounds.  Skin:    General: Skin is warm.  Neurological:     General: No focal deficit present.     Mental Status: He is alert and oriented to person, place, and time.  Psychiatric:        Mood and Affect: Mood normal.        Behavior: Behavior normal.    BP 124/72   Pulse 76   Temp 97.7 F (36.5 C)   Ht 6' (1.829 m)   Wt 165 lb (74.8 kg)   SpO2 97%   BMI 22.38 kg/m         Assessment & Plan:   Victor [name] in today with chief complaint of Sore Throat and head congestion   1. Viral pharyngitis (Primary) Force fluids Motrin or tylenol  OTC OTC decongestant Throat lozenges if help New toothbrush in 3 days     The above assessment and management plan was discussed with the patient. The patient verbalized  understanding of and has agreed to the management plan. Patient is aware to call the clinic if symptoms persist or worsen. Patient is aware when to return to the clinic for a follow-up visit. Patient educated on when it is appropriate to go to the emergency department.   Mary-Margaret Gladis, FNP   "
